# Patient Record
Sex: Female | Born: 1980 | Race: Black or African American | Hispanic: No | Smoking: Never smoker
Health system: Southern US, Community
[De-identification: ages and names within clinical notes are randomized; demographics above are authoritative.]

## PROBLEM LIST (undated history)

## (undated) ENCOUNTER — Inpatient Hospital Stay (HOSPITAL_COMMUNITY): Payer: Self-pay

## (undated) DIAGNOSIS — E669 Obesity, unspecified: Secondary | ICD-10-CM

## (undated) HISTORY — PX: DILATION AND CURETTAGE OF UTERUS: SHX78

---

## 2003-12-28 ENCOUNTER — Inpatient Hospital Stay: Payer: Self-pay

## 2004-01-03 ENCOUNTER — Inpatient Hospital Stay: Payer: Self-pay | Admitting: Unknown Physician Specialty

## 2004-01-03 ENCOUNTER — Other Ambulatory Visit: Payer: Self-pay

## 2004-07-20 ENCOUNTER — Emergency Department: Payer: Self-pay | Admitting: Emergency Medicine

## 2004-09-01 ENCOUNTER — Emergency Department: Payer: Self-pay | Admitting: Emergency Medicine

## 2004-09-25 ENCOUNTER — Emergency Department: Payer: Self-pay | Admitting: Unknown Physician Specialty

## 2004-10-11 ENCOUNTER — Ambulatory Visit: Payer: Self-pay | Admitting: Family Medicine

## 2004-11-12 ENCOUNTER — Ambulatory Visit: Payer: Self-pay | Admitting: Family Medicine

## 2005-01-22 ENCOUNTER — Emergency Department: Payer: Self-pay | Admitting: Emergency Medicine

## 2005-06-01 ENCOUNTER — Ambulatory Visit: Payer: Self-pay | Admitting: Family Medicine

## 2005-09-08 ENCOUNTER — Emergency Department: Payer: Self-pay | Admitting: Unknown Physician Specialty

## 2005-09-08 ENCOUNTER — Other Ambulatory Visit: Payer: Self-pay

## 2005-12-21 ENCOUNTER — Emergency Department: Payer: Self-pay | Admitting: Unknown Physician Specialty

## 2006-01-08 ENCOUNTER — Other Ambulatory Visit: Payer: Self-pay

## 2006-01-08 ENCOUNTER — Emergency Department: Payer: Self-pay | Admitting: Emergency Medicine

## 2006-05-08 ENCOUNTER — Emergency Department: Payer: Self-pay | Admitting: Emergency Medicine

## 2006-05-31 ENCOUNTER — Emergency Department: Payer: Self-pay | Admitting: Emergency Medicine

## 2006-10-18 ENCOUNTER — Emergency Department: Payer: Self-pay | Admitting: Emergency Medicine

## 2006-11-04 ENCOUNTER — Emergency Department: Payer: Self-pay | Admitting: Emergency Medicine

## 2006-11-05 ENCOUNTER — Ambulatory Visit: Payer: Self-pay | Admitting: Emergency Medicine

## 2006-11-26 ENCOUNTER — Ambulatory Visit: Payer: Self-pay | Admitting: Unknown Physician Specialty

## 2006-11-28 ENCOUNTER — Ambulatory Visit: Payer: Self-pay | Admitting: Unknown Physician Specialty

## 2006-12-07 ENCOUNTER — Emergency Department: Payer: Self-pay | Admitting: Emergency Medicine

## 2007-02-12 ENCOUNTER — Emergency Department: Payer: Self-pay | Admitting: Emergency Medicine

## 2007-07-19 IMAGING — CR DG CHEST 2V
1 series · 2 of 2 positions shown · non-contrast
Comparison: none

REASON FOR EXAM: shortness of breath
COMMENTS:

[Series 1: view not recorded · 0.17mm/px · 2 of 2 slices shown]
[im 1/2]
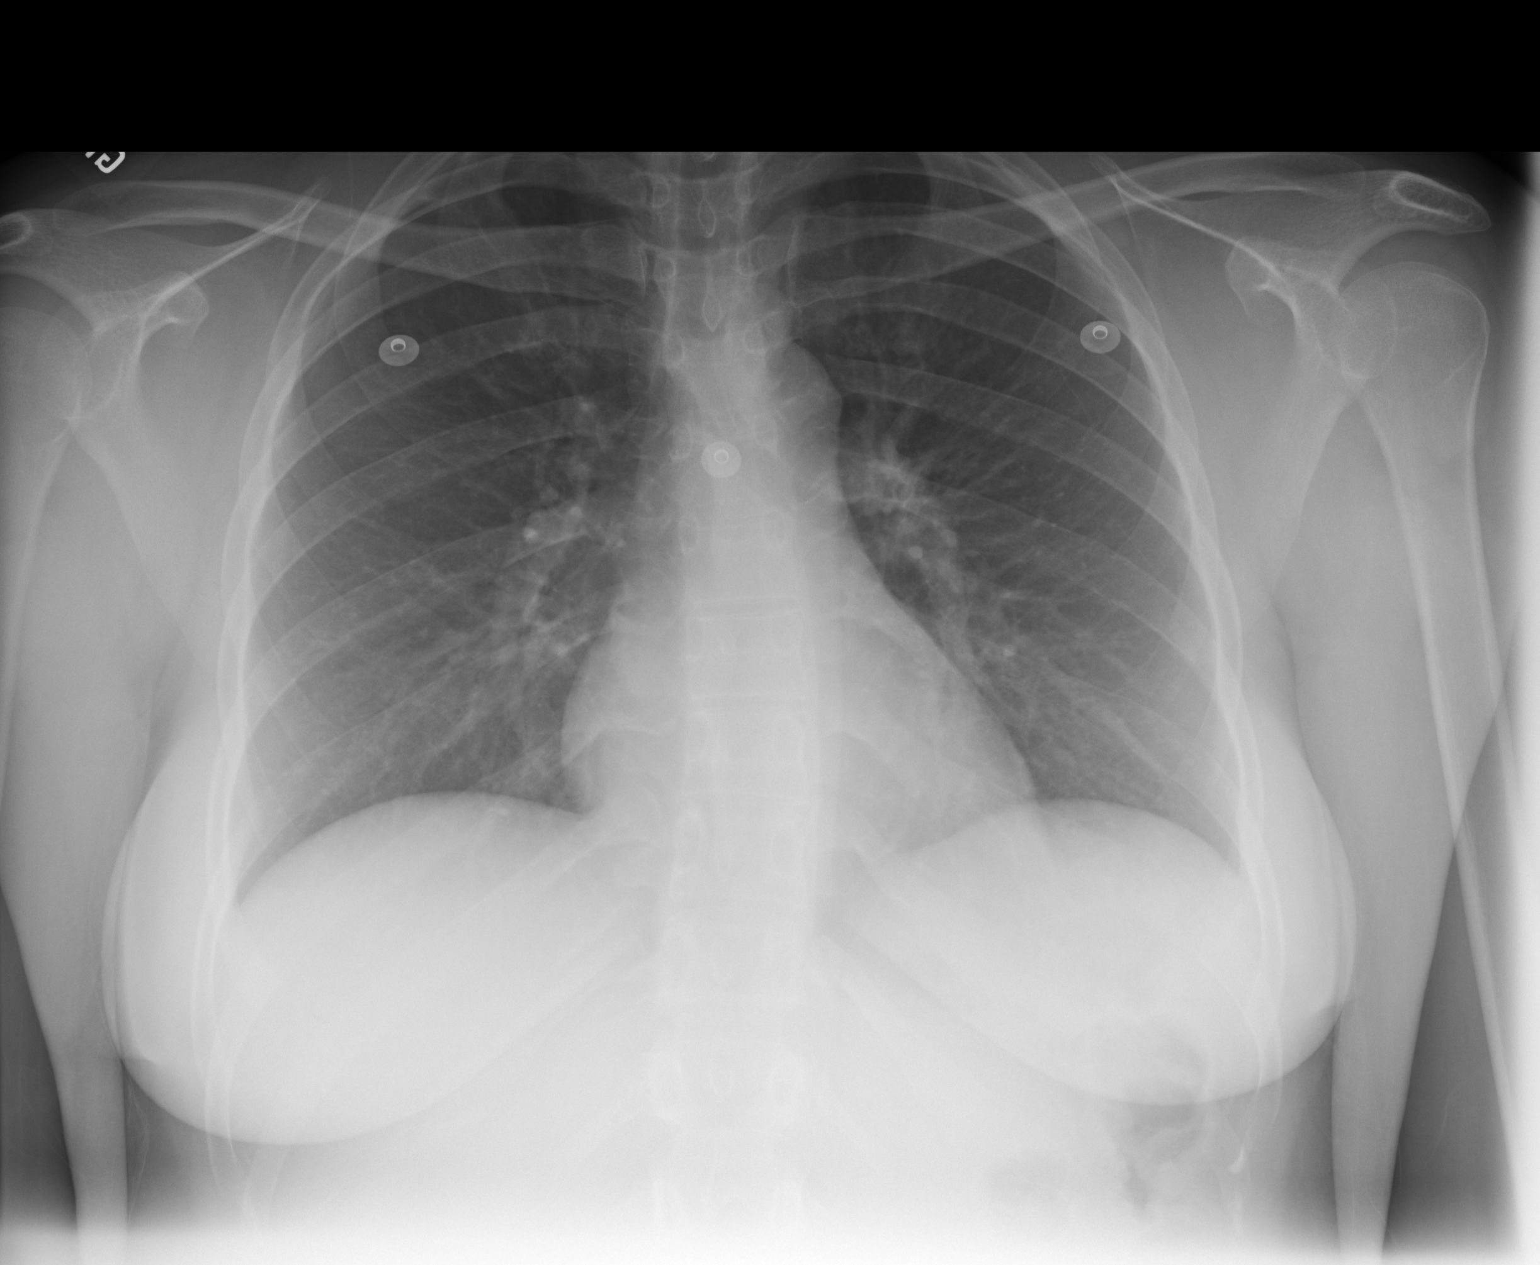
[im 2/2]
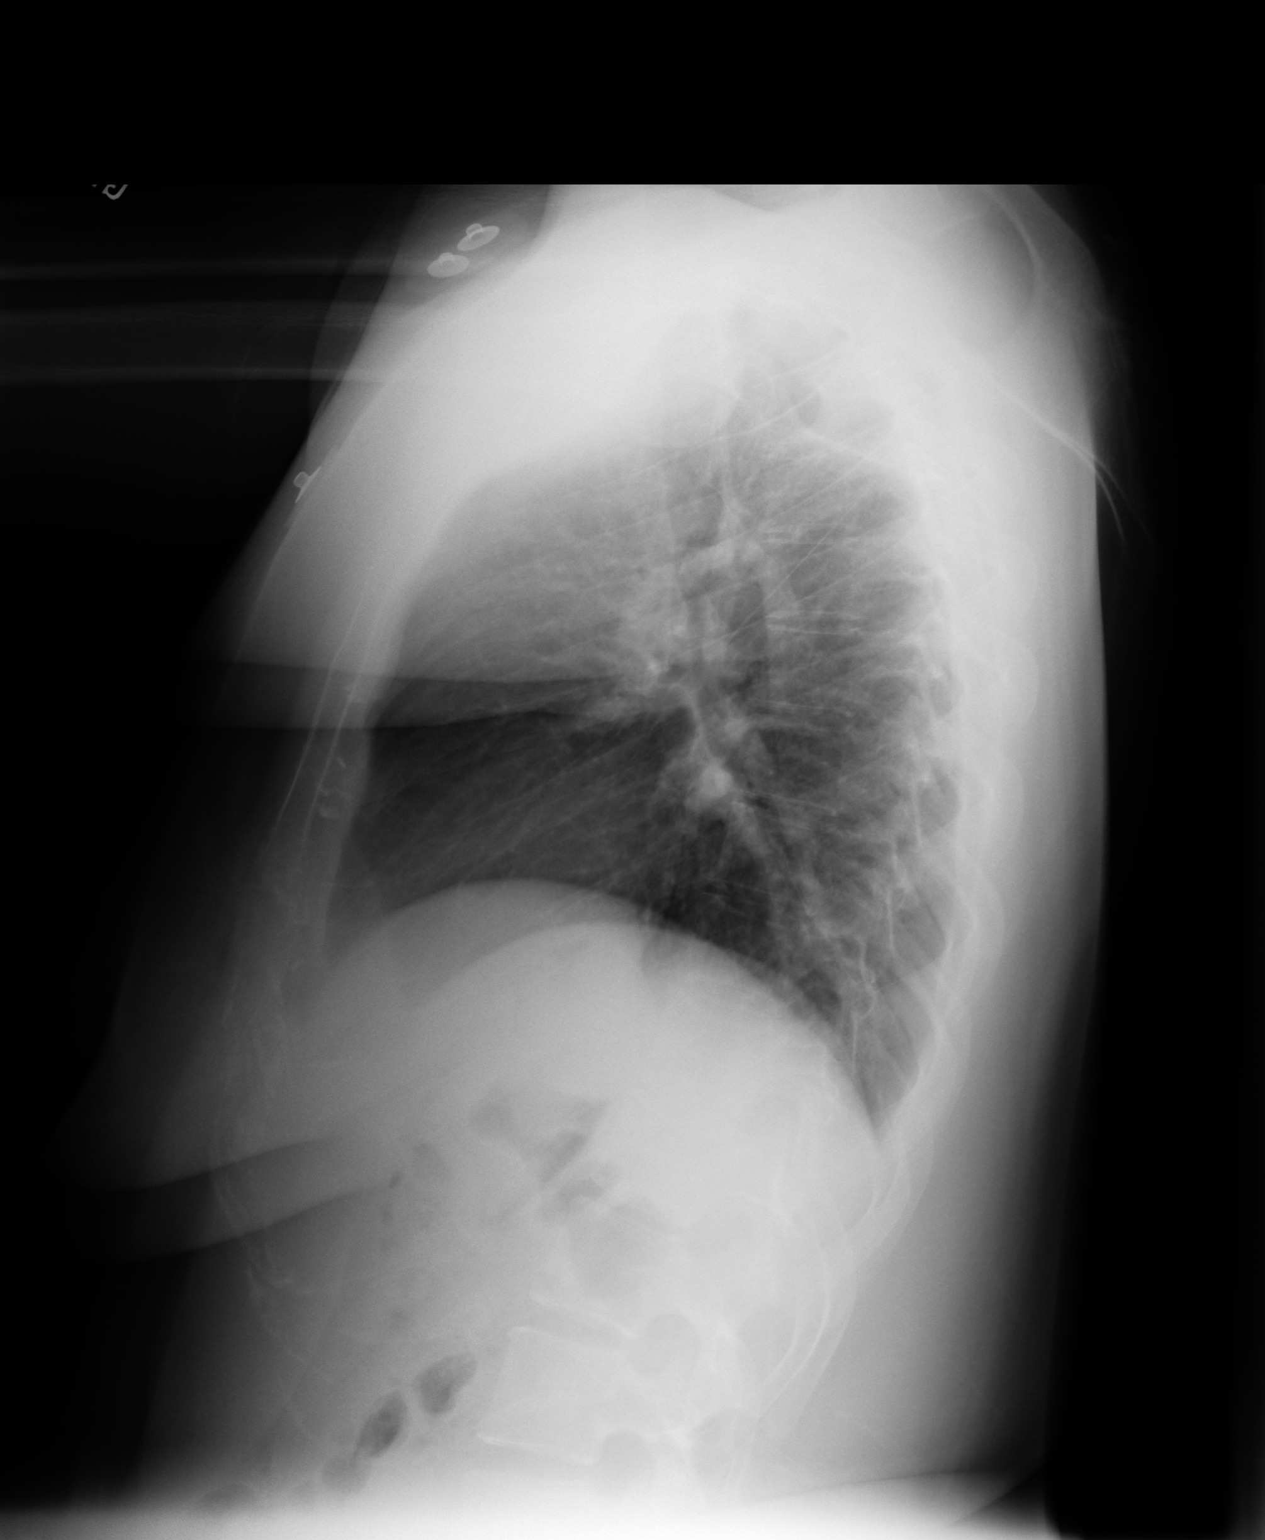

[2 of 2 positions shown; findings below may reference images not displayed]

PROCEDURE:     DXR - DXR CHEST PA (OR AP) AND LATERAL  - September 08, 2005 [DATE]

RESULT:          PA and lateral views were obtained and compared to the
prior exam of 01/23/2005.

The cardiomediastinal structures appear within normal limits.  The lung
fields are clear.  The vascularity is within normal limits with no effusions
identified.
IMPRESSION: The lung fields are clear.  The chest is basically
unchanged from the prior study.

## 2007-11-15 ENCOUNTER — Emergency Department: Payer: Self-pay | Admitting: Emergency Medicine

## 2007-11-18 IMAGING — CR DG CHEST 1V PORT
1 series · 1 of 1 positions shown · non-contrast
Comparison: none

REASON FOR EXAM: Chest pain
COMMENTS:

[view not recorded]
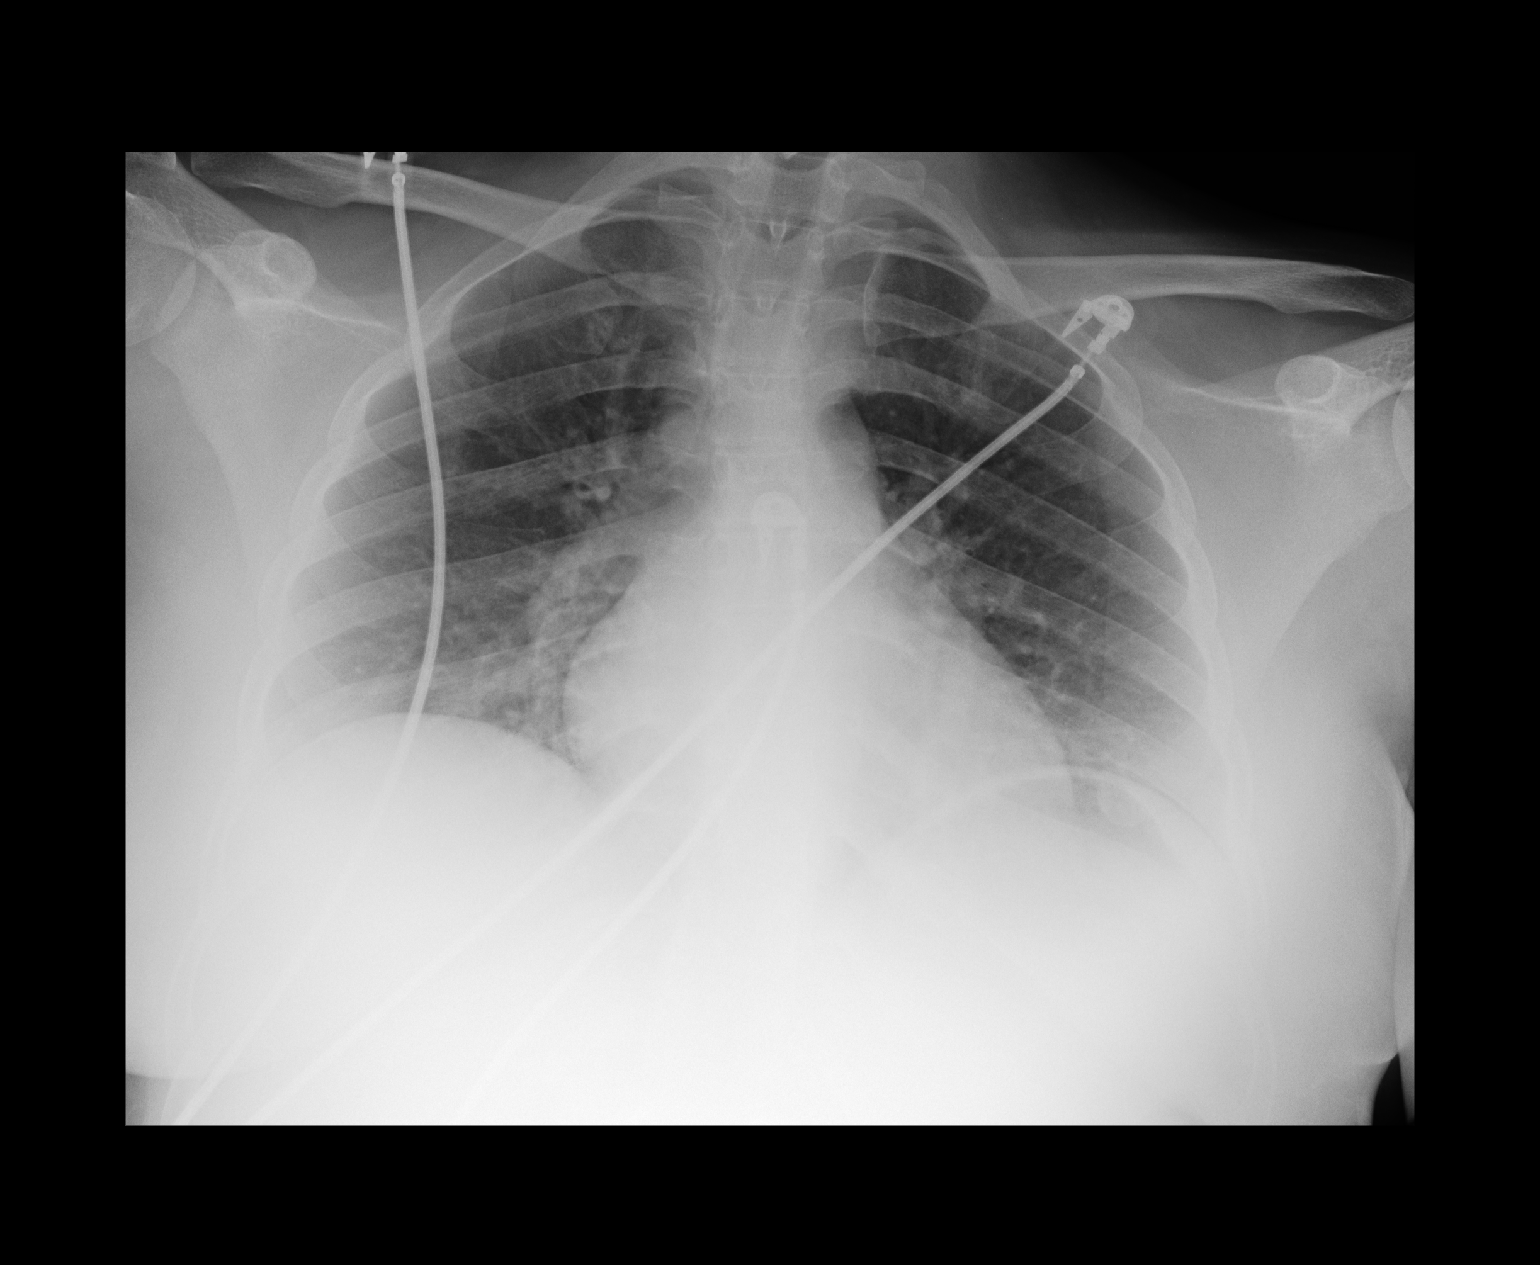

[1 of 1 positions shown; findings below may reference images not displayed]

PROCEDURE:     DXR - DXR PORTABLE CHEST SINGLE VIEW  - January 08, 2006  [DATE]

RESULT:     AP view of the chest shows the lung fields to be clear. No
pneumonia, pneumothorax or pleural effusion is seen. The heart size is
normal. Compared to a prior exam of 09/08/2005, no significant interval
changes are seen.
IMPRESSION: No acute changes are identified.

## 2009-01-07 ENCOUNTER — Emergency Department: Payer: Self-pay | Admitting: Emergency Medicine

## 2009-03-11 ENCOUNTER — Emergency Department: Payer: Self-pay | Admitting: Emergency Medicine

## 2011-09-07 ENCOUNTER — Encounter (HOSPITAL_COMMUNITY): Payer: Self-pay

## 2011-09-07 ENCOUNTER — Inpatient Hospital Stay (HOSPITAL_COMMUNITY)
Admission: AD | Admit: 2011-09-07 | Discharge: 2011-09-07 | Disposition: A | Discharge status: Home / Self Care | Payer: Medicaid Other | Source: Ambulatory Visit | Attending: Obstetrics & Gynecology | Admitting: Obstetrics & Gynecology

## 2011-09-07 ENCOUNTER — Inpatient Hospital Stay (HOSPITAL_COMMUNITY): Payer: Medicaid Other

## 2011-09-07 DIAGNOSIS — IMO0002 Reserved for concepts with insufficient information to code with codable children: Secondary | ICD-10-CM

## 2011-09-07 DIAGNOSIS — O364XX0 Maternal care for intrauterine death, not applicable or unspecified: Secondary | ICD-10-CM | POA: Insufficient documentation

## 2011-09-07 LAB — DIC (DISSEMINATED INTRAVASCULAR COAGULATION)PANEL
Prothrombin Time: 13.9 seconds (ref 11.6–15.2)
Smear Review: NONE SEEN

## 2011-09-07 LAB — KLEIHAUER-BETKE STAIN
Fetal Cells %: 0 %
Quantitation Fetal Hemoglobin: 0 mL

## 2011-09-07 NOTE — MAU Provider Note (Signed)
History     CSN: [id number]  Arrival date and time: 09/07/11 1900   First Provider Initiated Contact with Patient 09/07/11 1929      Chief Complaint  Patient presents with  . Decreased Fetal Movement   HPI This is a 31 year old G3P1010 with an EGA of 27.5 weeks with a history of a term IUFD and a spontaneous abortion around 18 weeks who presents with no fetal activity.  She is not able to state the last time she felt the baby move.  She denies vaginal bleeding, leaking fluid, vaginal discharge, fever, chills.  Her pregnancy is complicated by insulin controlled diabetes - reported under good control.  She received her prenatal care at [facility] in [place] starting around 6 weeks.  OB History    Grav Para Term Preterm Abortions TAB SAB Ect Mult Living   3 1 1  0 1 0 1 0 0 0      Past Medical History  Diagnosis Date  . Diabetes mellitus 2005    type 2    Past Surgical History  Procedure Date  . Dilation and curettage of uterus     No family history on file.  History  Substance Use Topics  . Smoking status: Never Smoker   . Smokeless tobacco: Not on file  . Alcohol Use: No    Allergies:  Allergies  Allergen Reactions  . Penicillins     Doesn't know reaction    No prescriptions prior to admission    Review of Systems  Constitutional: Negative for fever, chills and weight loss.  Eyes: Negative for blurred vision and photophobia.  Cardiovascular: Negative for chest pain and palpitations.  Gastrointestinal: Negative for heartburn, nausea, vomiting, abdominal pain, diarrhea and constipation.  Genitourinary: Negative for urgency and frequency.  Neurological: Negative for dizziness, weakness and headaches.   Physical Exam   Blood pressure 122/73, pulse 79, temperature 99.1 F (37.3 C), temperature source Oral, resp. rate 20, height 5\' 9"  (1.753 m), weight 121.383 kg (267 lb 9.6 oz), SpO2 100.00%.  Physical Exam  Constitutional: She is oriented to  person, place, and time. She appears well-developed and well-nourished.  HENT:  Head: Normocephalic and atraumatic.  GI: Soft. She exhibits no distension and no mass. There is no tenderness. There is no rebound and no guarding.  Neurological: She is alert and oriented to person, place, and time.  Skin: Skin is warm and dry.  Psychiatric: She has a normal mood and affect. Her behavior is normal. Judgment and thought content normal.   Results for orders placed during the hospital encounter of 09/07/11 (from the past 24 hour(s))  DIC (DISSEMINATED INTRAVASCULAR COAGULATION) PANEL     Status: Abnormal   Collection Time   09/07/11  7:57 PM      Component Value Range   Prothrombin Time 13.9  11.6 - 15.2 seconds   INR 1.05  0.00 - 1.49   aPTT 28  24 - 37 seconds   Fibrinogen 428  204 - 475 mg/dL   D-Dimer, Quant 1.30 (*) 0.00 - 0.48 ug/mL-FEU   Platelets 203  150 - 400 K/uL   Smear Review NO SCHISTOCYTES SEEN       *RADIOLOGY REPORT*  Clinical Data: 31 year old pregnant female with estimated gestation  of 27 weeks 4 days. No fetal movement or heartbeats on exam.  LIMITED OBSTETRIC ULTRASOUND  Number of Fetuses: 1  Heart Rate: Not identified  Movement: Absent  Presentation: Cephalic  Placental Location: Anterior  Previa: None  Amniotic Fluid (Subjective): Decreased  Vertical pocket: 2.9 cm  FL: 3.8 cm 22w 3d  MATERNAL FINDINGS:  Uterus/Adnexae: Unremarkable.  IMPRESSION:  Fetal demise.  Original Report Authenticated By: Rosendo Gros, M.D.   MAU Course  Procedures  Assessment and Plan  1.  27.5 week fetal demise  Discussed options with patient - staying and inducing labor tonight or go home and following up with her physicians at [facility].  I did call and spoke with the physician on call - Dr Allena Katz.  She was notified of the fetal demise and she will arrange for the patient to be called by one of the perinatal nurses from the clinic on Monday.    STINSON, JACOB JEHIEL 09/07/2011,  8:06 PM

## 2011-09-07 NOTE — MAU Note (Signed)
Pt brought to MAU 6 for no fetal movement. Attempted to place patient on fetal monitor at 60. Sharen Counter CNM at bedside at [facility] with bedside ultrasound to verify heart rate. Dr. Adrian Blackwater & ultrasound tech at bedside at 1929 for official bedside ultrasound. Fetal demise per official ultrasound & Dr. Adrian Blackwater.  Pt doesn't know last time she felt baby move. Gets prenatal care at [facility] in [place]. Denies contractions, vaginal bleeding or leaking of fluid.

## 2011-09-08 DIAGNOSIS — E669 Obesity, unspecified: Secondary | ICD-10-CM | POA: Insufficient documentation

## 2012-02-24 ENCOUNTER — Emergency Department: Payer: Self-pay | Admitting: Emergency Medicine

## 2012-03-10 ENCOUNTER — Emergency Department: Payer: Self-pay | Admitting: Emergency Medicine

## 2012-03-10 LAB — URINALYSIS, COMPLETE
Bilirubin,UR: NEGATIVE
Nitrite: NEGATIVE
Protein: NEGATIVE
RBC,UR: 1 /HPF (ref 0–5)
Squamous Epithelial: 2

## 2012-03-11 ENCOUNTER — Emergency Department: Payer: Self-pay | Admitting: Emergency Medicine

## 2012-03-11 LAB — CBC
HGB: 13.9 g/dL (ref 12.0–16.0)
MCHC: 32.6 g/dL (ref 32.0–36.0)
MCV: 88 fL (ref 80–100)
Platelet: 190 10*3/uL (ref 150–440)
RBC: 4.87 10*6/uL (ref 3.80–5.20)
RDW: 12.9 % (ref 11.5–14.5)

## 2012-03-11 LAB — URINALYSIS, COMPLETE
Bilirubin,UR: NEGATIVE
Glucose,UR: >=500 mg/dL (ref 0–75)
Leukocyte Esterase: NEGATIVE
Nitrite: NEGATIVE
Ph: 9 (ref 4.5–8.0)
Protein: 100
RBC,UR: 7624 /HPF (ref 0–5)
Specific Gravity: 1.03 (ref 1.003–1.030)

## 2012-04-10 ENCOUNTER — Emergency Department: Payer: Self-pay | Admitting: Internal Medicine

## 2012-04-10 LAB — URINALYSIS, COMPLETE
Bacteria: NONE SEEN
Bilirubin,UR: NEGATIVE
Glucose,UR: 150 mg/dL (ref 0–75)
RBC,UR: 4 /HPF (ref 0–5)
Specific Gravity: 1.025 (ref 1.003–1.030)
Squamous Epithelial: 10
WBC UR: 16 /HPF (ref 0–5)

## 2012-04-10 LAB — COMPREHENSIVE METABOLIC PANEL
Albumin: 3.8 g/dL (ref 3.4–5.0)
Alkaline Phosphatase: 78 U/L (ref 50–136)
BUN: 11 mg/dL (ref 7–18)
Bilirubin,Total: 0.6 mg/dL (ref 0.2–1.0)
Calcium, Total: 8.7 mg/dL (ref 8.5–10.1)
Chloride: 103 mmol/L (ref 98–107)
Creatinine: 0.83 mg/dL (ref 0.60–1.30)
EGFR (African American): >60
EGFR (Non-African Amer.): >60
SGOT(AST): 15 U/L (ref 15–37)
SGPT (ALT): 19 U/L (ref 12–78)
Total Protein: 7.8 g/dL (ref 6.4–8.2)

## 2012-04-10 LAB — CBC
HCT: 42.1 % (ref 35.0–47.0)
MCHC: 32.4 g/dL (ref 32.0–36.0)
RBC: 4.86 10*6/uL (ref 3.80–5.20)
WBC: 6.5 10*3/uL (ref 3.6–11.0)

## 2012-04-28 ENCOUNTER — Emergency Department: Payer: Self-pay | Admitting: Emergency Medicine

## 2012-05-11 DIAGNOSIS — E119 Type 2 diabetes mellitus without complications: Secondary | ICD-10-CM | POA: Insufficient documentation

## 2013-07-17 IMAGING — US US OB LIMITED
1 series · 14 of 18 positions shown · non-contrast
Comparison: none

CLINICAL DATA: 30-year-old pregnant female with estimated gestation
of 27 weeks 4 days.  No fetal movement or heartbeats on exam.

LIMITED OBSTETRIC ULTRASOUND
Number of Fetuses: 1
Heart Rate: Not identified
Movement: Absent
Presentation: Cephalic
Placental Location: Anterior
Previa: None
Amniotic Fluid (Subjective): Decreased
Vertical pocket:  2.9 cm
FL: 3.8 cm    22w   3d
MATERNAL FINDINGS:
Uterus/Adnexae: Unremarkable.

[Series 1: us ob limited · 18 acquisitions, 14 frames shown]
[im 1/18]
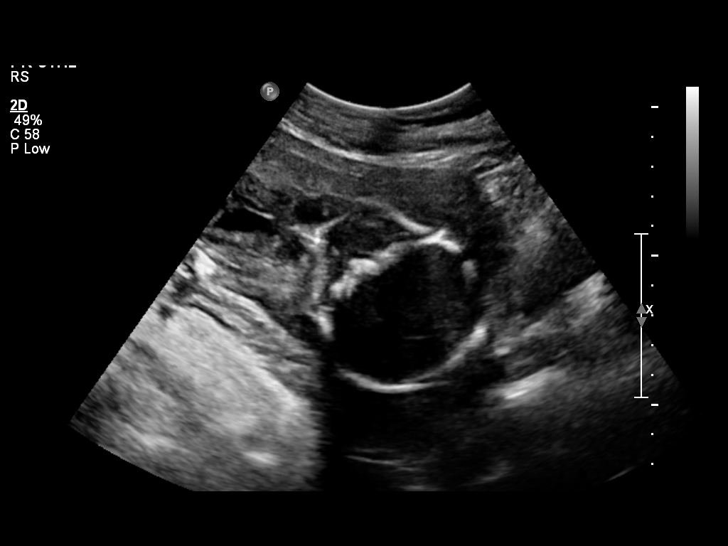
[im 2/18]
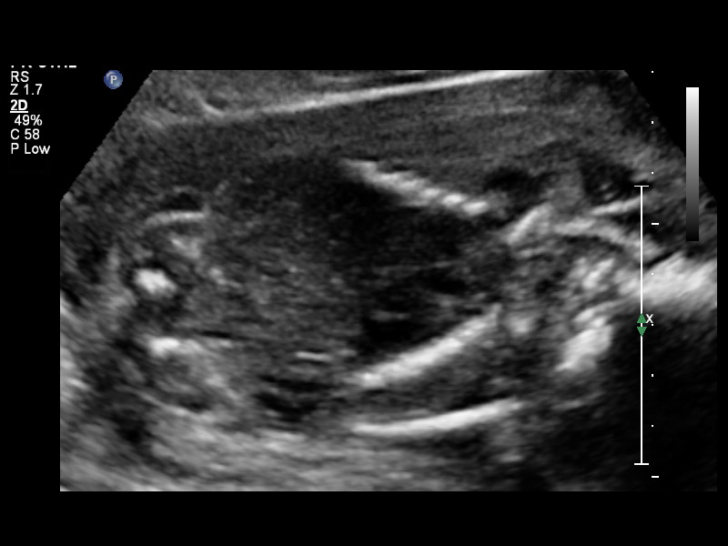
[im 4/18]
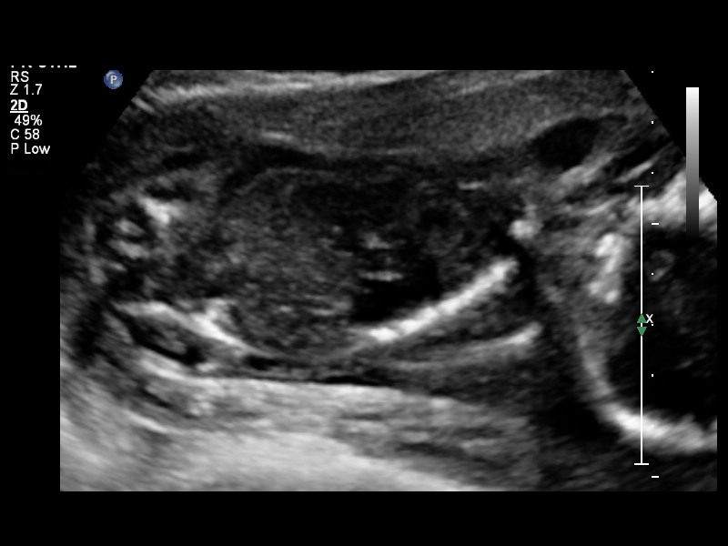
[im 5/18]
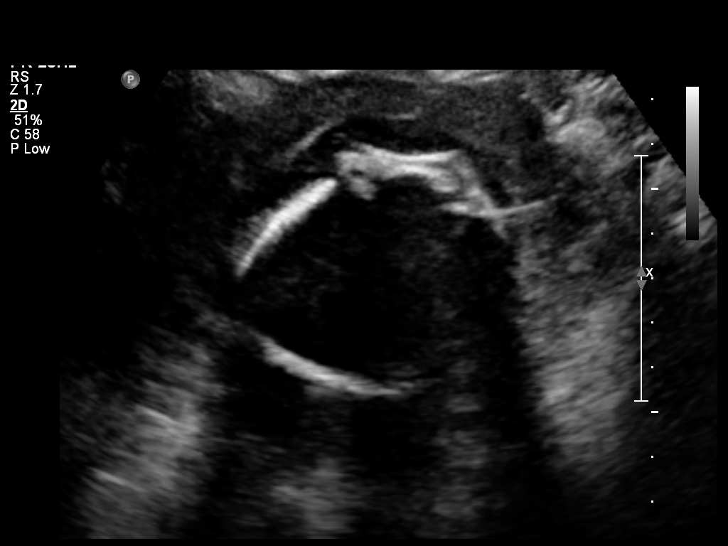
[im 6/18]
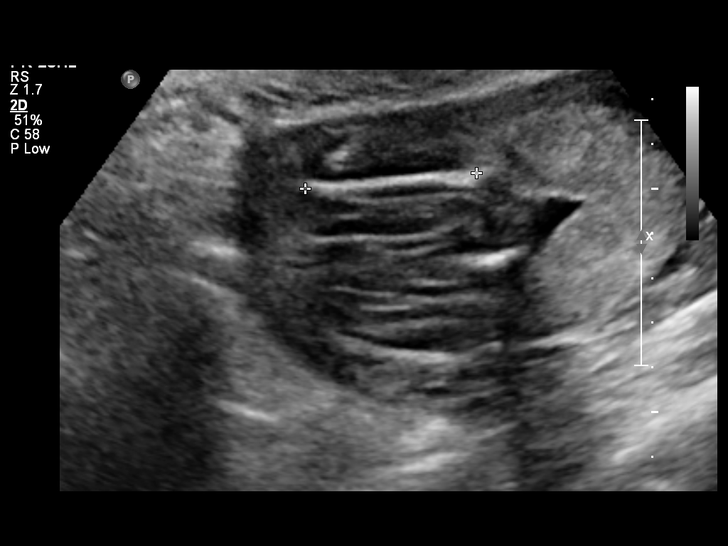
[im 8/18]
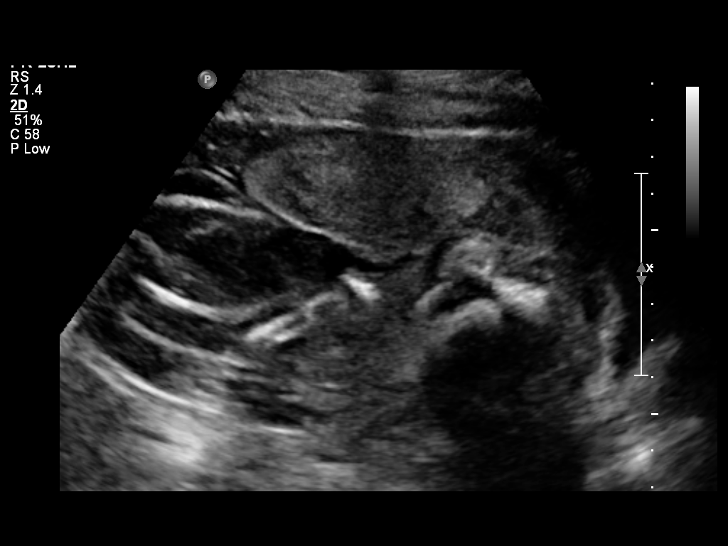
[im 9/18]
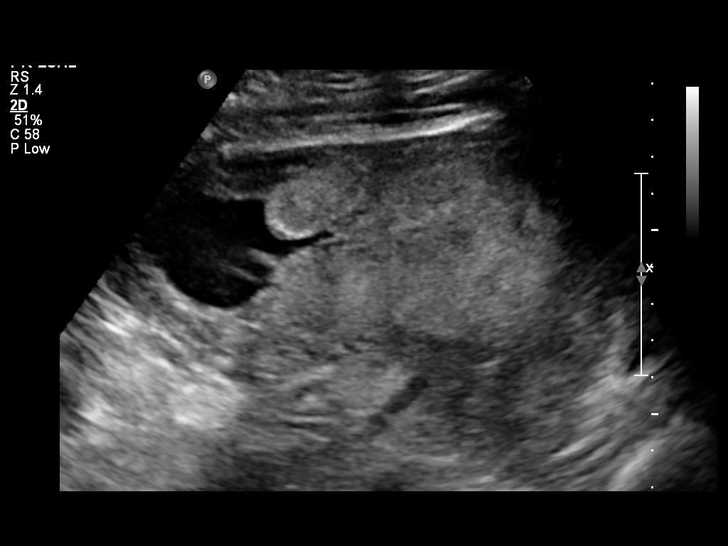
[im 10/18]
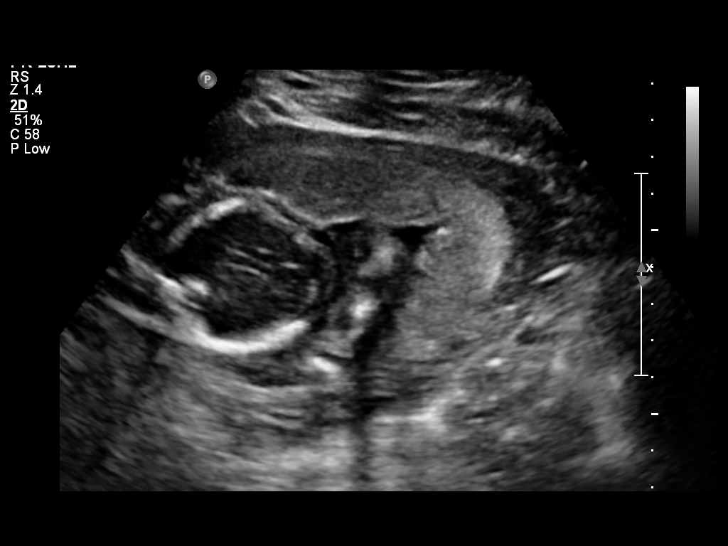
[im 11/18]
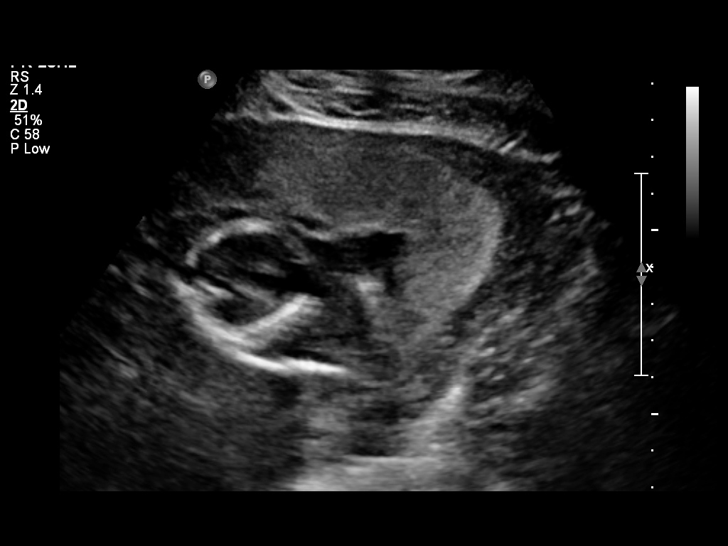
[im 13/18]
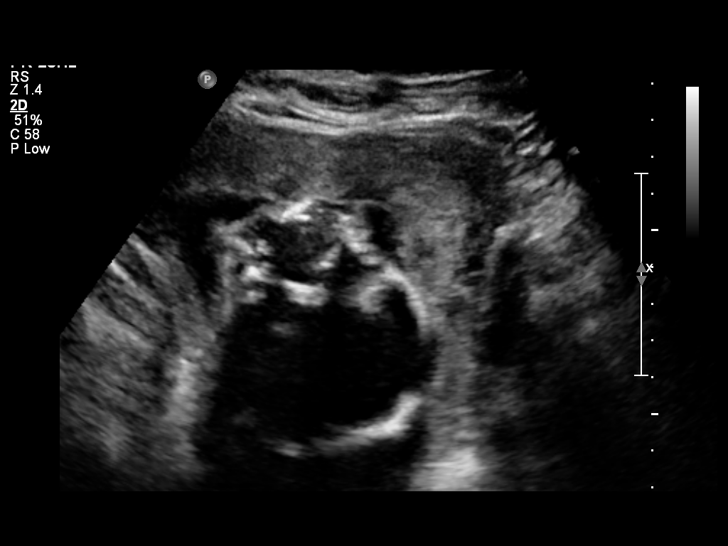
[im 14/18]
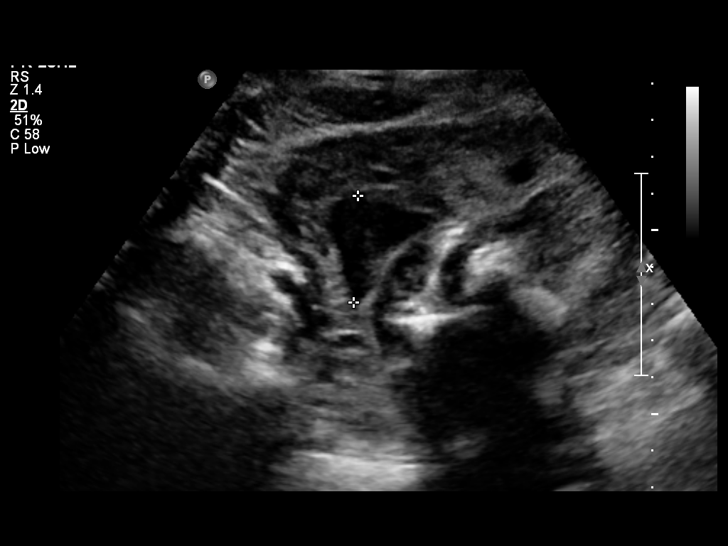
[im 15/18]
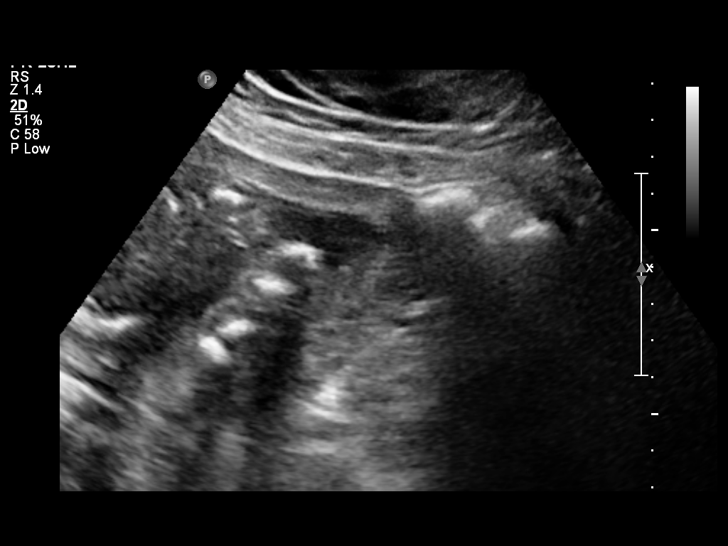
[im 17/18]
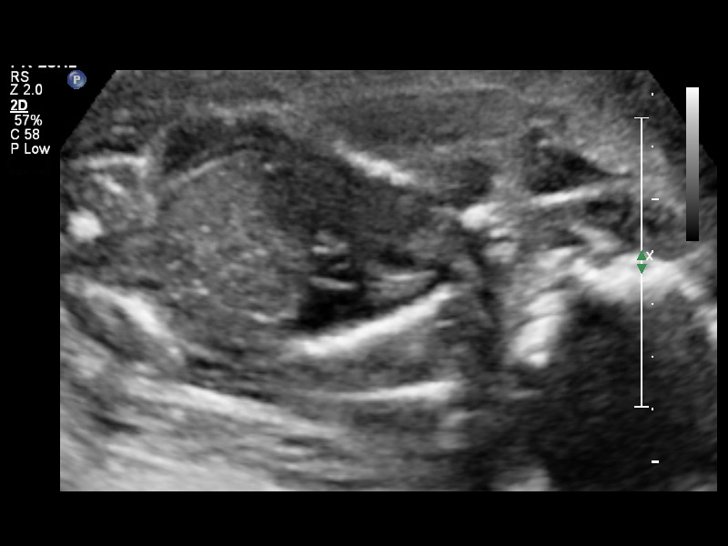
[im 18/18]
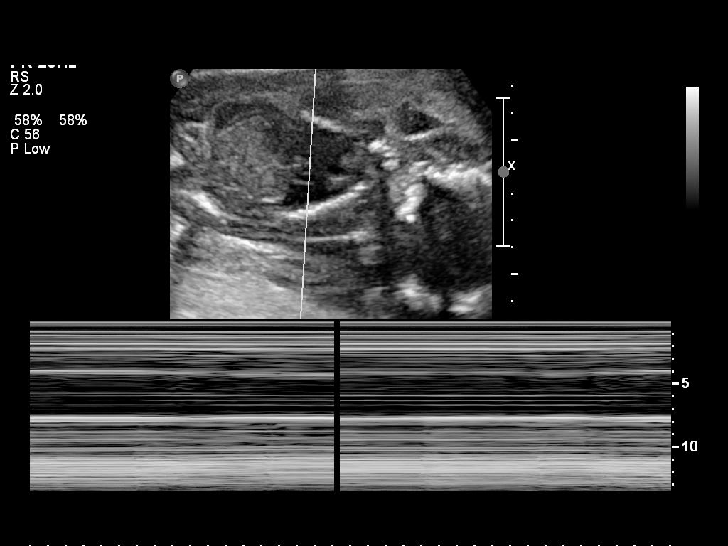

[14 of 18 positions shown; findings below may reference images not displayed]

IMPRESSION: Fetal demise.

## 2013-08-27 ENCOUNTER — Ambulatory Visit: Payer: Self-pay | Admitting: Family Medicine

## 2013-09-12 ENCOUNTER — Ambulatory Visit: Payer: Self-pay | Admitting: Family Medicine

## 2013-09-22 ENCOUNTER — Emergency Department: Payer: Self-pay | Admitting: Internal Medicine

## 2013-10-13 ENCOUNTER — Ambulatory Visit: Payer: Self-pay | Admitting: Family Medicine

## 2013-10-29 ENCOUNTER — Encounter: Payer: Self-pay | Admitting: Maternal & Fetal Medicine

## 2013-11-12 ENCOUNTER — Ambulatory Visit: Payer: Self-pay | Admitting: Family Medicine

## 2013-12-14 ENCOUNTER — Encounter (HOSPITAL_COMMUNITY): Payer: Self-pay

## 2013-12-21 ENCOUNTER — Emergency Department: Payer: Self-pay | Admitting: Emergency Medicine

## 2014-01-15 ENCOUNTER — Emergency Department: Payer: Self-pay | Admitting: Emergency Medicine

## 2014-05-06 ENCOUNTER — Ambulatory Visit
Admit: 2014-05-06 | Disposition: A | Discharge status: Home / Self Care | Payer: Self-pay | Attending: Internal Medicine | Admitting: Internal Medicine

## 2014-06-05 NOTE — Consult Note (Signed)
Referral Information:  Reason for Referral 34 yo [phone] here for preconception counseling due to Type 2 DM and history of IUFD at 65 weeks ('cord accident'), 18 week pregnancy loss(delivered in toilet--reports no contractions or bleeding), 28 week IUFD (macerated fetus, infarct noted on placenta and fetal karyotype normal. She has had a preconception consultation with [facility] in 2013 and with a Education officer, environmental.  She declined maternal/paternal karyotype at the time of that visit.  A partial thrombophila panel (lab reported no specimen for additional tests) demonstrated normal Factor V and Prothrombin Gene testing.  Her mother had a lower extrem DVT at age 71.   Referring Physician [facility]   Past Obstetrical Hx 2005, [facility], IUFD--reports double nuchal cord 2007--18 week delivery--reports going to bathroom and noticing fetus in toilet 2013--28 week IUFD, [facility], macreated fetus, Benyamin Jeff infarct noted on placental path, karyotype negative, IgG CMV positive, IGM negative, autopsy negative. She took low dose aspirin during that pregnancy previous pregnancies were with prior husband; she is now with a different partner   Home Medications: Medication Instructions Status  NovoLOG Mix 70/30 30 units-70 units/mL subcutaneous suspension 45 unit(s) subcutaneous once a day before breakfast and 35 units before supper Active   Allergies:   PCN: Rash  Bactrim: N/V/Diarrhea  Vital Signs/Notes:  Nursing Vital Signs: **Vital Signs.:   17-Sep-15 09:15  Vital Signs Type Routine  Temperature Temperature (F) 98.5  Celsius 36.9  Temperature Source oral  Pulse Pulse 84  Respirations Respirations 18  Systolic BP Systolic BP 136  Diastolic BP (mmHg) Diastolic BP (mmHg) 64  Mean BP 88  Pulse Ox % Pulse Ox % 98  Oxygen Delivery Room Air/ 21 %   Perinatal Consult:  PGyn Hx 2013 pap, aSCUS positive HPV   Past Medical History cont'd T2 DM dignosed incidentally in 2005--she saw a woman in the [employer]  with DM and decided to check her blood glucose--she then went to ED for evaluation and treatment of DM she reports recent HgbA1c 8% FASTING blood glucose 209 mg/dl today she did not bring log book she reports normal opthalmologic exam within the last year and no history of end organ disease/proteinuria   PSurg Hx dilation and curretage x 2   FHx thromboses, metabolic disorders either side, mother DVT, HTN, DM   Occupation Mother works in group home   Occupation Father also works in [employer] group home--new partner   Impression/Recommendations:  Impression 34 yo [id number] here for preconception consultation due to T2 DM IUFD x 2 and 18 week pregnancy loss.  I agree with prior recommendations to complete a thrombophilia evaluation--she still will need antiphospholipid antibody screening, protein C and s, antithrombin 3 testing.  We addressed improved glycemic control prior to pregnancy, although I agree with previous consultation that this is not likely the cause of her IUFDs.  Karyotype was offered to her in the past, however not obtained.  SIS was also recommended to evalaute her uterine cavity.  We addressed diabetes management during pregnancy and the importance of preconception glycemic control to reduce risk for birth defects.  She is currently taking a multivitamin containing folic acid.  I recommended she delay pregnancy for 2 months in order to optimize her glycemic control and overall health.   2. LSIL pap with incomplete colposcopy in 2014--we did not address her follow up.  She can be referred back to the cervix clinic at [facility] for reevaluation if she has not had follow up cervical screening since that 2014  cervix clinic visit.   Recommendations 1. Recommended completion of thrombophilia panel. She currently is uninsured and did not wish to pay for those tests. Recommended she have them performed as soon as pregnancy confirmed.   She will need: antiphospholipid antibody screening  (aplas, anti B2 glycoprotein) and protein C and S  She will be on low dose aspirin for preeclampsia risk reduction (due to levated bmi and T2 DM) but may need lovenox if condition such as APLAS diagnosed 2. Recommend TSH, if not performed recently, continue prenatal vitamins 3. I recommended improved glycemic control prior to pregnancy--she did not bring log book today--I stressed the need for her  to check blood glucose regularly and follow diabetic diet. I also recommended she begin an exercise program (walking) and try to decrease weight prior to conception 4. Recommend baseline cervical length assessment at time of midtrimester anatomic survey and q 2-4 weeks (can perform with growth scans) until 28 weeks.  5. I would recommend she have prenatal care in a high risk ob practice   Plan:  Prenatal Diagnosis Options Level II US   Ultrasound at what gestational ages Monthly >24 weeks   Antepartum Testing Weekly, starting at 28 weeks, due to IUFD, twice weekly at ~34 weeks due to DM   Additional Testing HbA1C, Folate/prenatal vitamins   Delivery at what gestational age < 39 weeks    Total Time Spent with Patient 45 minutes   >50% of visit spent in couseling/coordination of care yes   Office Use Only [id number]  Level 3 (40min) NEW office consult detailed   Coding Description: MATERNAL CONDITIONS/HISTORY INDICATION(S).   Diabetes - DM.  Electronic Signatures: Consuelo PandySmall, Shabreka Coulon (MD)  (Signed 17-Sep-15 15:39)  Authored: Referral, Home Medications, Allergies, Vital Signs/Notes, Consult, Impression, Plan, Billing, Coding Description   Last Updated: 17-Sep-15 15:39 by Consuelo PandySmall, Antonetta Clanton (MD)

## 2014-07-13 ENCOUNTER — Encounter (INDEPENDENT_AMBULATORY_CARE_PROVIDER_SITE_OTHER): Payer: Self-pay

## 2014-07-13 ENCOUNTER — Ambulatory Visit: Payer: Medicaid Other

## 2014-08-05 ENCOUNTER — Ambulatory Visit: Payer: Self-pay | Admitting: Ophthalmology

## 2014-09-09 ENCOUNTER — Ambulatory Visit: Payer: Self-pay

## 2014-09-21 ENCOUNTER — Ambulatory Visit: Payer: Self-pay

## 2014-10-07 ENCOUNTER — Ambulatory Visit: Payer: Self-pay

## 2014-10-17 ENCOUNTER — Emergency Department
Admission: EM | Admit: 2014-10-17 | Discharge: 2014-10-17 | Disposition: A | Discharge status: Home / Self Care | Payer: Medicaid Other | Attending: Emergency Medicine | Admitting: Emergency Medicine

## 2014-10-17 ENCOUNTER — Encounter: Payer: Self-pay | Admitting: Emergency Medicine

## 2014-10-17 DIAGNOSIS — Z3202 Encounter for pregnancy test, result negative: Secondary | ICD-10-CM | POA: Insufficient documentation

## 2014-10-17 DIAGNOSIS — H9202 Otalgia, left ear: Secondary | ICD-10-CM | POA: Insufficient documentation

## 2014-10-17 DIAGNOSIS — Z880 Allergy status to penicillin: Secondary | ICD-10-CM | POA: Insufficient documentation

## 2014-10-17 DIAGNOSIS — K0889 Other specified disorders of teeth and supporting structures: Secondary | ICD-10-CM

## 2014-10-17 DIAGNOSIS — E119 Type 2 diabetes mellitus without complications: Secondary | ICD-10-CM | POA: Insufficient documentation

## 2014-10-17 DIAGNOSIS — K088 Other specified disorders of teeth and supporting structures: Secondary | ICD-10-CM | POA: Insufficient documentation

## 2014-10-17 MED ORDER — CLINDAMYCIN HCL 150 MG PO CAPS: 300.0000 mg | ORAL_CAPSULE | Freq: Three times a day (TID) | ORAL | Status: DC

## 2014-10-17 MED ORDER — TRAMADOL HCL 50 MG PO TABS: 50.0000 mg | ORAL_TABLET | Freq: Four times a day (QID) | ORAL | Status: DC | PRN

## 2014-10-17 MED ORDER — IBUPROFEN 800 MG PO TABS: 800.0000 mg | ORAL_TABLET | Freq: Three times a day (TID) | ORAL | Status: DC

## 2014-10-17 NOTE — ED Notes (Signed)
Pt reports toothache x2 days, now with left ear pain.

## 2014-10-17 NOTE — ED Provider Notes (Signed)
[facility] Emergency Department Provider Note  ____________________________________________  Time seen: Approximately 7:04 PM  I have reviewed the triage vital signs and the nursing notes.   HISTORY  Chief Complaint Dental Pain and Otalgia   HPI TEKOA HAMOR is a 34 y.o. female is here with complaint of toothache 2 days. She also now has a left earache. She denies any fever or chills. She denies any URI symptoms. She has not been to a dentist and at least 3-4 years. She is not taking any over-the-counter medication for her dental pain. She states the pain in her left ear is 10 out of 10.   Past Medical History  Diagnosis Date  . Diabetes mellitus 2005    type 2    There are no active problems to display for this patient.   Past Surgical History  Procedure Laterality Date  . Dilation and curettage of uterus      Current Outpatient Rx  Name  Route  Sig  Dispense  Refill  . clindamycin (CLEOCIN) 150 MG capsule   Oral   Take 2 capsules (300 mg total) by mouth 3 (three) times daily.   60 capsule   0   . ibuprofen (ADVIL,MOTRIN) 800 MG tablet   Oral   Take 1 tablet (800 mg total) by mouth 3 (three) times daily.   30 tablet   0   . traMADol (ULTRAM) 50 MG tablet   Oral   Take 1 tablet (50 mg total) by mouth every 6 (six) hours as needed.   8 tablet   0     Allergies Penicillins  No family history on file.  Social History Social History  Substance Use Topics  . Smoking status: Never Smoker   . Smokeless tobacco: None  . Alcohol Use: No    Review of Systems Constitutional: No fever/chills Eyes: No visual changes. ENT: No sore throat.  Positive for dental pain. Positive for left ear pain. Cardiovascular: Denies chest pain. Respiratory: Denies shortness of breath. Gastrointestinal: No abdominal pain.  No nausea, no vomiting. . Genitourinary: Negative for dysuria. Musculoskeletal: Negative for back pain. Skin: Negative for  rash. Neurological: Negative for headaches, focal weakness or numbness.  10-point ROS otherwise negative.  ____________________________________________   PHYSICAL EXAM:  VITAL SIGNS: ED Triage Vitals  Enc Vitals Group     BP 10/17/14 1751 136/80 mmHg     Pulse Rate 10/17/14 1751 88     Resp 10/17/14 1751 18     Temp 10/17/14 1751 99.7 F (37.6 C)     Temp Source 10/17/14 1751 Oral     SpO2 10/17/14 1751 99 %     Weight 10/17/14 1751 240 lb (108.863 kg)     Height 10/17/14 1751  (1.753 m)     Head Cir --      Peak Flow --      Pain Score 10/17/14 1756 10     Pain Loc --      Pain Edu? --      Excl. in GC? --     Constitutional: Alert and oriented. Well appearing and in no acute distress. Eyes: Conjunctivae are normal. PERRL. EOMI. Head: Atraumatic. Nose: No congestion/rhinnorhea.   Ears: Right EAC and TM clear. Left EAC is clear left TM dull but no erythema is noted. Mouth/Throat: Mucous membranes are moist.  Oropharynx non-erythematous. Left upper molars in poor hygiene and repair. There is no obvious cavity there is some tenderness on palpation of  the gum around particular tooth in question. Neck: No stridor.  Supple Hematological/Lymphatic/Immunilogical: No cervical lymphadenopathy. Cardiovascular: Normal rate, regular rhythm. Grossly normal heart sounds.  Good peripheral circulation. Respiratory: Normal respiratory effort.  No retractions. Lungs CTAB. Gastrointestinal: Soft and nontender. No distention. No abdominal bruits. No CVA tenderness. Musculoskeletal: No lower extremity tenderness nor edema.  No joint effusions. Neurologic:  Normal speech and language. No gross focal neurologic deficits are appreciated. No gait instability. Skin:  Skin is warm, dry and intact. No rash noted. Psychiatric: Mood and affect are normal. Speech and behavior are normal.  ____________________________________________   LABS (all labs ordered are listed, but only abnormal results  are displayed)  Labs Reviewed - No data to display _ PROCEDURES  Procedure(s) performed: None  Critical Care performed: No  ____________________________________________   INITIAL IMPRESSION / ASSESSMENT AND PLAN / ED COURSE  Pertinent labs & imaging results that were available during my care of the patient were reviewed by me and considered in my medical decision making (see chart for details).  Patient was given a list of dental clinics to follow up with. She was started on iron clindamycin for infection, ibuprofen 800 mg for inflammation and pain. She is also given tramadol 8 tablets until she can contact someone on Tuesday. ____________________________________________   FINAL CLINICAL IMPRESSION(S) / ED DIAGNOSES  Final diagnoses:  Pain, dental  Acute ear pain, left      Tommi Rumps, PA-C 10/17/14 2010  Sharyn Creamer, MD 10/18/14 0001

## 2014-10-17 NOTE — Discharge Instructions (Signed)
Dental Pain °Toothache is pain in or around a tooth. It may get worse with chewing or with cold or heat.  °HOME CARE °· Your dentist may use a numbing medicine during treatment. If so, you may need to avoid eating until the medicine wears off. Ask your dentist about this. °· Only take medicine as told by your dentist or doctor. °· Avoid chewing food near the painful tooth until after all treatment is done. Ask your dentist about this. °GET HELP RIGHT AWAY IF:  °· The problem gets worse or new problems appear. °· You have a fever. °· There is redness and puffiness (swelling) of the face, jaw, or neck. °· You cannot open your mouth. °· There is pain in the jaw. °· There is very bad pain that is not helped by medicine. °MAKE SURE YOU:  °· Understand these instructions. °· Will watch your condition. °· Will get help right away if you are not doing well or get worse. °Document Released: 07/18/2007 Document Revised: 04/23/2011 Document Reviewed: 07/18/2007 °ExitCare® Patient Information ©2015 [employer]. This information is not intended to replace advice given to you by your health care provider. Make sure you discuss any questions you have with your health care provider. °OPTIONS FOR DENTAL FOLLOW UP CARE ° °[place] Department of Health and Human Services - Local Safety Net Dental Clinics °http://www.ncdhhs.gov/dph/oralhealth/services/safetynetclinics.htm °  °[facility] (336-562-3123) ° °[facility] (919-933-9087) ° °[facility] (919-663-1744 ext [phone]) ° °[facility] (336-570-6415) ° °[facility] (919-968-2025) °This clinic caters to the indigent population and is on a lottery system. °Location: °[facility], [address], [place] °Clinic Hours: °Wednesdays from 6pm - 9pm, patients seen by a lottery system. °For dates, call or go to www.med.[facility].edu/[facility]/patients/Dental-[facility] °Services: °Cleanings, fillings and simple  extractions. °Payment Options: °DENTAL WORK IS FREE OF CHARGE. Bring proof of income or support. °Best way to get seen: °Arrive at 5:15 pm - this is a lottery, NOT first come/first serve, so arriving earlier will not increase your chances of being seen. °  °  °[facility] °919-537-3737 °Select option 1 for emergencies °  °Location: °[facility], [address], [place] °Clinic Hours: °No walk-ins accepted - call the day before to schedule an appointment. °Check in times are 9:30 am and 1:30 pm. °Services: °Simple extractions, temporary fillings, pulpectomy/pulp debridement, uncomplicated abscess drainage. °Payment Options: °PAYMENT IS DUE AT THE TIME OF SERVICE.  Fee is usually $100-200, additional surgical procedures (e.g. abscess drainage) may be extra. °Cash, checks, Visa/MasterCard accepted.  Can file Medicaid if patient is covered for dental - patient should call case worker to check. °No discount for [facility] patients. °Best way to get seen: °MUST call the day before and get onto the schedule. Can usually be seen the next 1-2 days. No walk-ins accepted. °  °  °[facility] °919-933-9087 °  °Location: °[facility], [address], [place] °Clinic Hours: °M, W, Th, F 8am or 1:30pm, Tues 9a or 1:30 - first come/first served. °Services: °Simple extractions, temporary fillings, uncomplicated abscess drainage.  You do not need to be an [place] resident. °Payment Options: °PAYMENT IS DUE AT THE TIME OF SERVICE. °Dental insurance, otherwise sliding scale - bring proof of income or support. °Depending on income and treatment needed, cost is usually $50-200. °Best way to get seen: °Arrive early as it is first come/first served. °  °  °  [facility] °[phone] °  °Location: °[address] °Clinic Hours: °Mon-Thu 8a-5p °Services: °Most basic dental services including  extractions and fillings. °Payment Options: °PAYMENT IS DUE AT THE TIME OF SERVICE. °Sliding scale, up to 50% off - bring proof if income or support. °Medicaid with dental option accepted. °Best way to get seen: °Call to schedule an appointment, can usually be seen within 2 weeks OR they will try to see walk-ins - show up at 8a or 2p (you may have to wait). °  °  °[facility] °919-245-2435 °[place] RESIDENTS ONLY °  °Location: °[facility], [address], [place], [place] [address] °Clinic Hours: By appointment only. °Monday - Thursday 8am-5pm, Friday 8am-12pm °Services: Cleanings, fillings, extractions. °Payment Options: °PAYMENT IS DUE AT THE TIME OF SERVICE. °Cash, Visa or MasterCard. Sliding scale - $30 minimum per service. °Best way to get seen: °Come in to office, complete packet and make an appointment - need proof of income °or support monies for each household member and proof of [place] residence. °Usually takes about a month to get in. °  °  °[facility] °[phone] °  °Location: °[address] [place] °Clinic Hours: Walk-in Urgent Care Dental Services are offered Monday-Friday mornings only. °The numbers of emergencies accepted daily is limited to the number of °providers available. °Maximum 15 - Mondays, Wednesdays & Thursdays °Maximum 10 - Tuesdays & Fridays °Services: °You do not need to be a [place] resident to be seen for a dental emergency. °Emergencies are defined as pain, swelling, abnormal bleeding, or dental trauma. Walkins will receive x-rays if needed. °NOTE: Dental cleaning is not an emergency. °Payment Options: °PAYMENT IS DUE AT THE TIME OF SERVICE. °Minimum co-pay is $40.00 for uninsured patients. °Minimum co-pay is $3.00 for Medicaid with dental coverage. °Dental Insurance is accepted and must be presented at time of visit. °Medicare does not cover dental. °Forms of payment: Cash, credit card,  checks. °Best way to get seen: °If not previously registered with the clinic, walk-in dental registration begins at 7:15 am and is on a first come/first serve basis. °If previously registered with the clinic, call to make an appointment. °  °  °[facility] °919-776-4359 °[place] RESIDENTS ONLY °  °Location: °[address], [place], [place] °Clinic Hours: °Mon-Thu 10a-2p °Services: Extractions only! °Payment Options: °FREE (donations accepted) - bring proof of income or support °Best way to get seen: °Call and schedule an appointment OR come at 8am on the 1st Monday of every month (except for holidays) when it is first come/first served. °  °  °[facility] °[phone] °  °Location: °[address], [place] °Clinic Hours: °Friday mornings °Services, Payment Options, Best way to get seen: °Call for info ° °

## 2014-10-18 LAB — POCT PREGNANCY, URINE: Preg Test, Ur: NEGATIVE

## 2014-10-21 ENCOUNTER — Ambulatory Visit: Payer: Self-pay

## 2014-11-02 ENCOUNTER — Ambulatory Visit: Payer: Self-pay

## 2014-11-03 ENCOUNTER — Ambulatory Visit: Payer: Self-pay | Admitting: Internal Medicine

## 2014-11-30 ENCOUNTER — Ambulatory Visit: Payer: Self-pay

## 2014-12-23 ENCOUNTER — Other Ambulatory Visit: Payer: Self-pay

## 2014-12-28 ENCOUNTER — Ambulatory Visit: Payer: Self-pay

## 2015-01-20 ENCOUNTER — Other Ambulatory Visit: Payer: Self-pay

## 2015-01-20 LAB — CBC AND DIFFERENTIAL
HEMATOCRIT: 39 % (ref 36–46)
HEMOGLOBIN: 12.3 g/dL (ref 12.0–16.0)
NEUTROS ABS: 4 /uL
Platelets: 230 10*3/uL (ref 150–399)
WBC: 6.9 10*3/mL

## 2015-01-20 LAB — HEPATIC FUNCTION PANEL
ALT: 7 U/L (ref 7–35)
AST: 10 U/L — AB (ref 13–35)
Alkaline Phosphatase: 69 U/L (ref 25–125)
Bilirubin, Total: 0.2 mg/dL

## 2015-01-20 LAB — BASIC METABOLIC PANEL
BUN: 13 mg/dL (ref 4–21)
Creatinine: 0.8 mg/dL (ref 0.5–1.1)
GLUCOSE: 95 mg/dL
POTASSIUM: 3.9 mmol/L (ref 3.4–5.3)
SODIUM: 144 mmol/L (ref 137–147)

## 2015-01-20 LAB — TSH: TSH: 1.22 u[IU]/mL (ref 0.41–5.90)

## 2015-01-20 LAB — LIPID PANEL
CHOLESTEROL: 116 mg/dL (ref 0–200)
HDL: 38 mg/dL (ref 35–70)
LDL CALC: 68 mg/dL
Triglycerides: 50 mg/dL (ref 40–160)

## 2015-01-20 LAB — MICROALBUMIN, URINE: Microalb, Ur: 57.4

## 2015-01-20 LAB — HEMOGLOBIN A1C: Hemoglobin A1C: 7.1

## 2015-01-26 ENCOUNTER — Ambulatory Visit: Payer: Self-pay | Admitting: Internal Medicine

## 2015-02-13 DIAGNOSIS — E669 Obesity, unspecified: Secondary | ICD-10-CM

## 2015-02-13 HISTORY — DX: Obesity, unspecified: E66.9

## 2015-02-13 NOTE — L&D Delivery Note (Addendum)
Delivery Note 34yo [id number] at 37+1wks with didi TIUP who presented with PROM this morning at 0630. She was 4cm on admission, and pitocin started on arrival. She progressed to fully dilated and was moved to the OR for delivery.     Karie MainlandDorsey, Boy Serenah [[id number]][[id number]]  At 3:58 PM a viable and healthy female "L'Darius" was delivered via Vaginal, Spontaneous Delivery (Presentation: LOA ).  APGAR: 8, 9; weight 4 lb 15.2 oz (2245 g).  Nuchal cord x1, reduced on the perineum. Placenta status: intact with 3 vc. Delayed cord clamping x60sec. Baby to maternal abdomen.  Anesthesia:  Epidural Episiotomy:  none Lacerations:  none Suture Repair: none Est. Blood Loss (mL): 200  Just after Baby A delivered, Baby B's head was supported and his membranes carefully ruptured. His fluid was clear and his head was settled into the pelvic with manual support. During the next hour, He developed deep variable decels with each contraction. A vacuum was placed on the table but not required.    Smiley HousemanDorsey, BoyB Ronda [[id number]][[id number]]  At 5:40 PM a viable and healthy female "N'vadis" was delivered via  (Presentation: ROP).  APGAR: 8/8 weight 6lb 10oz 3000g Placenta status: spontaneous, intact  Cord:  2 VC .  He was vigorous. Delayed cord clamping x60 sec. Placed on maternal abdomen.   Anesthesia:  Epidural Episiotomy:  none Lacerations:  none Suture Repair: none Est. Blood Loss (mL): 200  Spontaneous placenta delivery. 40u postpartum pit given. Firm fundus.  Mom to postpartum.   Baby A to Couplet care / Skin to Skin.   Baby B to Couplet care / Skin to Skin.  Christeen DouglasBEASLEY, Aaron Boeh 10/07/2015, 6:16 PM   Pregnancy complicated by: 1. DiDi TIUP - fetal echo 06/30/15 wnl x2 2. Hx of IUFD, preterm loss and 2 SAB 3. Morbid obesity 4. Pre-pregnancy Type 2 DM: currently insulin needs: - am 54 of N and 24 of R - Lunch 24 of R - Dinner 24 of R - qhs 38 of N On admission, her FBG was 149  5. 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  Abnormal MSAFP- Sono by MFM neg for ONTD and gastroschisis. Weekly testing from 28 wks with planned delivery by 37wks 7. Hx of multiple pregnancy loss, including IUFD 8. Rubella non-immune

## 2015-03-01 ENCOUNTER — Ambulatory Visit: Payer: Self-pay

## 2015-03-01 DIAGNOSIS — E119 Type 2 diabetes mellitus without complications: Secondary | ICD-10-CM

## 2015-03-10 ENCOUNTER — Ambulatory Visit: Payer: Self-pay | Admitting: Ophthalmology

## 2015-03-16 ENCOUNTER — Emergency Department
Admission: EM | Admit: 2015-03-16 | Discharge: 2015-03-16 | Disposition: A | Discharge status: Home / Self Care | Payer: Medicaid Other | Attending: Emergency Medicine | Admitting: Emergency Medicine

## 2015-03-16 ENCOUNTER — Encounter: Payer: Self-pay | Admitting: Emergency Medicine

## 2015-03-16 ENCOUNTER — Emergency Department: Payer: Medicaid Other

## 2015-03-16 DIAGNOSIS — A599 Trichomoniasis, unspecified: Secondary | ICD-10-CM

## 2015-03-16 DIAGNOSIS — O24111 Pre-existing diabetes mellitus, type 2, in pregnancy, first trimester: Secondary | ICD-10-CM | POA: Insufficient documentation

## 2015-03-16 DIAGNOSIS — E669 Obesity, unspecified: Secondary | ICD-10-CM | POA: Insufficient documentation

## 2015-03-16 DIAGNOSIS — O468X1 Other antepartum hemorrhage, first trimester: Secondary | ICD-10-CM

## 2015-03-16 DIAGNOSIS — Z791 Long term (current) use of non-steroidal anti-inflammatories (NSAID): Secondary | ICD-10-CM | POA: Diagnosis not present

## 2015-03-16 DIAGNOSIS — O30041 Twin pregnancy, dichorionic/diamniotic, first trimester: Secondary | ICD-10-CM

## 2015-03-16 DIAGNOSIS — O2341 Unspecified infection of urinary tract in pregnancy, first trimester: Secondary | ICD-10-CM | POA: Diagnosis not present

## 2015-03-16 DIAGNOSIS — Z792 Long term (current) use of antibiotics: Secondary | ICD-10-CM | POA: Diagnosis not present

## 2015-03-16 DIAGNOSIS — Z880 Allergy status to penicillin: Secondary | ICD-10-CM | POA: Insufficient documentation

## 2015-03-16 DIAGNOSIS — O99211 Obesity complicating pregnancy, first trimester: Secondary | ICD-10-CM | POA: Diagnosis not present

## 2015-03-16 DIAGNOSIS — O209 Hemorrhage in early pregnancy, unspecified: Secondary | ICD-10-CM | POA: Insufficient documentation

## 2015-03-16 DIAGNOSIS — A5901 Trichomonal vulvovaginitis: Secondary | ICD-10-CM | POA: Diagnosis not present

## 2015-03-16 DIAGNOSIS — O418X10 Other specified disorders of amniotic fluid and membranes, first trimester, not applicable or unspecified: Secondary | ICD-10-CM | POA: Insufficient documentation

## 2015-03-16 DIAGNOSIS — O98811 Other maternal infectious and parasitic diseases complicating pregnancy, first trimester: Secondary | ICD-10-CM | POA: Insufficient documentation

## 2015-03-16 DIAGNOSIS — N939 Abnormal uterine and vaginal bleeding, unspecified: Secondary | ICD-10-CM

## 2015-03-16 DIAGNOSIS — Z3A08 8 weeks gestation of pregnancy: Secondary | ICD-10-CM | POA: Insufficient documentation

## 2015-03-16 DIAGNOSIS — N390 Urinary tract infection, site not specified: Secondary | ICD-10-CM

## 2015-03-16 LAB — CHLAMYDIA/NGC RT PCR (ARMC ONLY)
Chlamydia Tr: NOT DETECTED
N gonorrhoeae: NOT DETECTED

## 2015-03-16 LAB — URINALYSIS COMPLETE WITH MICROSCOPIC (ARMC ONLY)
Bilirubin Urine: NEGATIVE
Glucose, UA: NEGATIVE mg/dL
NITRITE: NEGATIVE
PH: 5 (ref 5.0–8.0)
PROTEIN: 30 mg/dL — AB
SPECIFIC GRAVITY, URINE: 1.025 (ref 1.005–1.030)

## 2015-03-16 LAB — WET PREP, GENITAL
CLUE CELLS WET PREP: NONE SEEN
SPERM: NONE SEEN
YEAST WET PREP: NONE SEEN

## 2015-03-16 LAB — ABO/RH: ABO/RH(D): A POS

## 2015-03-16 LAB — HCG, QUANTITATIVE, PREGNANCY: hCG, Beta Chain, Quant, S: 168676 m[IU]/mL — ABNORMAL HIGH (ref <5)

## 2015-03-16 MED ORDER — NITROFURANTOIN MONOHYD MACRO 100 MG PO CAPS
ORAL_CAPSULE | ORAL | Status: AC
  Administered 2015-03-16: 100 mg via ORAL
  Filled 2015-03-16: qty 1

## 2015-03-16 MED ORDER — METRONIDAZOLE 500 MG PO TABS
ORAL_TABLET | ORAL | Status: AC
  Administered 2015-03-16: 2000 mg via ORAL
  Filled 2015-03-16: qty 4

## 2015-03-16 MED ORDER — ONDANSETRON 4 MG PO TBDP
ORAL_TABLET | ORAL | Status: AC
  Administered 2015-03-16: 4 mg via ORAL
  Filled 2015-03-16: qty 1

## 2015-03-16 MED ORDER — ONDANSETRON 4 MG PO TBDP
4.0000 mg | ORAL_TABLET | Freq: Once | ORAL | Status: AC
  Administered 2015-03-16: 4 mg via ORAL

## 2015-03-16 MED ORDER — NITROFURANTOIN MONOHYD MACRO 100 MG PO CAPS
100.0000 mg | ORAL_CAPSULE | Freq: Once | ORAL | Status: AC
  Administered 2015-03-16: 100 mg via ORAL

## 2015-03-16 MED ORDER — METRONIDAZOLE 500 MG PO TABS
2000.0000 mg | ORAL_TABLET | Freq: Once | ORAL | Status: AC
  Administered 2015-03-16: 2000 mg via ORAL

## 2015-03-16 MED ORDER — NITROFURANTOIN MONOHYD MACRO 100 MG PO CAPS: 100.0000 mg | ORAL_CAPSULE | Freq: Two times a day (BID) | ORAL | Status: DC

## 2015-03-16 NOTE — Discharge Instructions (Signed)
Please return to the emergency department if you develop severe pain, increased vaginal bleeding, lightheadedness or fainting, or any other symptoms concerning to you.

## 2015-03-16 NOTE — ED Provider Notes (Signed)
[facility] Emergency Department Provider Note  ____________________________________________  Time seen: Approximately 7:54 PM  I have reviewed the triage vital signs and the nursing notes.   HISTORY  Chief Complaint Vaginal Bleeding    HPI Megan Stanton is a 35 y.o. female G5 P0 A4 approximately [redacted] weeks pregnant by LMP with a history of diabetes presenting with vaginal bleeding.Patient reports that she went to work this morning and had a small amount of "light blood" on her underwear. She had some additional vaginal spotting later today. No abdominal cramping, lightheadedness or shortness of breath. No abdominal pain. No fever, or change in vaginal discharge. Last sexual intercourse was greater than 3 weeks ago. The patient has not yet established OB care.  No history of treatment for gonorrhea or chlamydia. Patient OB history: First pregnancy resulting in fetal demise from nuchal cord; second and third pregnancies were early miscarriages between 6 and 12 weeks; fourth pregnancy was second trimester fetal demise with decreased amniotic fluid. She reports significant complications from her diabetes during pregnancy.  Past Medical History  Diagnosis Date  . Diabetes mellitus 2005    type 2    There are no active problems to display for this patient.   Past Surgical History  Procedure Laterality Date  . Dilation and curettage of uterus      Current Outpatient Rx  Name  Route  Sig  Dispense  Refill  . clindamycin (CLEOCIN) 150 MG capsule   Oral   Take 2 capsules (300 mg total) by mouth 3 (three) times daily.   60 capsule   0   . ibuprofen (ADVIL,MOTRIN) 800 MG tablet   Oral   Take 1 tablet (800 mg total) by mouth 3 (three) times daily.   30 tablet   0   . nitrofurantoin, macrocrystal-monohydrate, (MACROBID) 100 MG capsule   Oral   Take 1 capsule (100 mg total) by mouth 2 (two) times daily.   14 capsule   0   . traMADol (ULTRAM) 50 MG  tablet   Oral   Take 1 tablet (50 mg total) by mouth every 6 (six) hours as needed.   8 tablet   0     Allergies Penicillins  No family history on file.  Social History Social History  Substance Use Topics  . Smoking status: Never Smoker   . Smokeless tobacco: None  . Alcohol Use: No    Review of Systems Constitutional: No fever/chills. No lightheadedness or syncope. Eyes: No visual changes. ENT: No sore throat. Cardiovascular: Denies chest pain, palpitations. Respiratory: Denies shortness of breath.  No cough. Gastrointestinal: No abdominal pain.  No nausea, no vomiting.  No diarrhea.  No constipation. Genitourinary: Negative for dysuria. Positive vaginal bleeding  Musculoskeletal: Negative for back pain. Skin: Negative for rash. Neurological: Negative for headaches, focal weakness or numbness.  10-point ROS otherwise negative.  ____________________________________________   PHYSICAL EXAM:  VITAL SIGNS: ED Triage Vitals  Enc Vitals Group     BP 03/16/15 1825 139/86 mmHg     Pulse Rate 03/16/15 1825 92     Resp 03/16/15 1825 18     Temp 03/16/15 1825 99.1 F (37.3 C)     Temp Source 03/16/15 1825 Oral     SpO2 03/16/15 1825 98 %     Weight 03/16/15 1825 274 lb (124.286 kg)     Height 03/16/15 1825  (1.753 m)     Head Cir --      Peak Flow --  Pain Score --      Pain Loc --      Pain Edu? --      Excl. in GC? --     Constitutional: Alert and oriented. Well appearing and in no acute distress. Answer question appropriately. Eyes: Conjunctivae are normal.  EOMI. Head: Atraumatic. Nose: No congestion/rhinnorhea. Mouth/Throat: Mucous membranes are moist.  Neck: No stridor.  Supple.   Cardiovascular: Normal rate, regular rhythm. No murmurs, rubs or gallops.  Respiratory: Normal respiratory effort.  No retractions. Lungs CTAB.  No wheezes, rales or ronchi. Gastrointestinal: Obese. Soft and nontender. No distention. No peritoneal  signs. Genitourinary: Normal-appearing external genitalia without lesions. Normal vaginal exam with minimal bloody discharge, normal-appearing cervix, normal vaginal wall tissue. Bimanual exam is negative for CMT, adnexal tenderness to palpation, no palpable masses.  External os is open but internal os is closed. Musculoskeletal: No LE edema.  Neurologic:  Normal speech and language. No gross focal neurologic deficits are appreciated.  Skin:  Skin is warm, dry and intact. No rash noted. Psychiatric: Mood and affect are normal. Speech and behavior are normal.  Normal judgement.  ____________________________________________   LABS (all labs ordered are listed, but only abnormal results are displayed)  Labs Reviewed  WET PREP, GENITAL - Abnormal; Notable for the following:    Trich, Wet Prep PRESENT (*)    WBC, Wet Prep HPF POC MODERATE (*)    All other components within normal limits  HCG, QUANTITATIVE, PREGNANCY - Abnormal; Notable for the following:    hCG, Beta Francene Finders [id number] (*)    All other components within normal limits  URINALYSIS COMPLETEWITH MICROSCOPIC ([facility] ONLY) - Abnormal; Notable for the following:    Color, Urine RED (*)    APPearance CLOUDY (*)    Ketones, ur 1+ (*)    Hgb urine dipstick 3+ (*)    Protein, ur 30 (*)    Leukocytes, UA 3+ (*)    Bacteria, UA RARE (*)    Squamous Epithelial / LPF 6-30 (*)    All other components within normal limits  CHLAMYDIA/NGC RT PCR ([facility] ONLY)  URINE CULTURE  CBC  BASIC METABOLIC PANEL  ABO/RH   ____________________________________________  EKG  Not indicated ____________________________________________  RADIOLOGY  US Ob Comp Less 14 Wks  03/16/2015  CLINICAL DATA:  35 year old pregnant patient with vaginal bleeding for 1 day. EXAM: OBSTETRIC <14 WK [place] AND TRANSVAGINAL OB US TECHNIQUE: Both transabdominal and transvaginal ultrasound examinations were performed for complete evaluation of the gestation  as well as the maternal uterus, adnexal regions, and pelvic cul-de-sac. Transvaginal technique was performed to assess early pregnancy. COMPARISON:  OB ultrasound 03/11/2012. FINDINGS: Twin Gestation: Gestation A - Intrauterine gestational sac: Ovoid shaped gestational sac in the fundal portion of the endometrial canal. Yolk sac:  Present. Embryo:  Present. Cardiac Activity: Present. Heart Rate: 175  bpm CRL:  18.3  mm   8 w   2 d                  [place] EDC: 10/24/2015. Gestation B - Intrauterine gestational sac: Ovoid shaped gestational sac in the fundal portion of the endometrial canal. Yolk sac:  Present. Embryo:  Present. Cardiac Activity: Present. Heart Rate: 169  bpm CRL:  17.5  mm 8 w 2 d                  [place] EDC: 10/24/2015 Subchorionic hemorrhage: Small to moderate volume (1.9 x 0.6 x 1.7 cm) of  heterogeneously hypoechoic material adjacent to the gestational sacs, compatible with subchorionic hemorrhage. Maternal uterus/adnexae: There appears to be 2 separate amnions and 2 separate chorions. Right ovary could not be visualized secondary to overlying bowel. Left ovary is normal in appearance with probable degenerating corpus luteum cyst. No free fluid in the cul-de-sac. IMPRESSION: 1. Dichorionic twin pregnancy with 2 viable fetuses, with estimated gestational age of [redacted] weeks and 2 days, and normal fetal heart rates, as above. 2. Small to moderate subchorionic hemorrhage. 3. Right ovary cannot be visualized secondary to bowel gas. Probable degenerating corpus luteum cyst in the left ovary. Electronically Signed   By: Trudie Reed M.D.   On: 03/16/2015 21:04   US Ob Transvaginal  03/16/2015  CLINICAL DATA:  35 year old pregnant patient with vaginal bleeding for 1 day. EXAM: OBSTETRIC <14 WK [place] AND TRANSVAGINAL OB US TECHNIQUE: Both transabdominal and transvaginal ultrasound examinations were performed for complete evaluation of the gestation as well as the maternal uterus, adnexal regions, and pelvic  cul-de-sac. Transvaginal technique was performed to assess early pregnancy. COMPARISON:  OB ultrasound 03/11/2012. FINDINGS: Twin Gestation: Gestation A - Intrauterine gestational sac: Ovoid shaped gestational sac in the fundal portion of the endometrial canal. Yolk sac:  Present. Embryo:  Present. Cardiac Activity: Present. Heart Rate: 175  bpm CRL:  18.3  mm   8 w   2 d                  [place] EDC: 10/24/2015. Gestation B - Intrauterine gestational sac: Ovoid shaped gestational sac in the fundal portion of the endometrial canal. Yolk sac:  Present. Embryo:  Present. Cardiac Activity: Present. Heart Rate: 169  bpm CRL:  17.5  mm 8 w 2 d                  [place] EDC: 10/24/2015 Subchorionic hemorrhage: Small to moderate volume (1.9 x 0.6 x 1.7 cm) of heterogeneously hypoechoic material adjacent to the gestational sacs, compatible with subchorionic hemorrhage. Maternal uterus/adnexae: There appears to be 2 separate amnions and 2 separate chorions. Right ovary could not be visualized secondary to overlying bowel. Left ovary is normal in appearance with probable degenerating corpus luteum cyst. No free fluid in the cul-de-sac. IMPRESSION: 1. Dichorionic twin pregnancy with 2 viable fetuses, with estimated gestational age of [redacted] weeks and 2 days, and normal fetal heart rates, as above. 2. Small to moderate subchorionic hemorrhage. 3. Right ovary cannot be visualized secondary to bowel gas. Probable degenerating corpus luteum cyst in the left ovary. Electronically Signed   By: Trudie Reed M.D.   On: 03/16/2015 21:04    ____________________________________________   PROCEDURES  Procedure(s) performed: None  Critical Care performed: No ____________________________________________   INITIAL IMPRESSION / ASSESSMENT AND PLAN / ED COURSE  Pertinent labs & imaging results that were available during my care of the patient were reviewed by me and considered in my medical decision making (see chart for details).  35  y.o. G5 P0A4 with a complicated history of early and late miscarriages and fetal demise presenting with vaginal spotting. The patient has stable vital signs and a reassuring abdominal exam. The differential includes threatened AB, rule out ectopic pregnancy. We will evaluate for UTI or vaginal infection.  ----------------------------------------- 9:52 PM on 03/16/2015 -----------------------------------------  The patient is positive for Trichomonas I will treat her for that.  The patient's ultrasound shows a dichorionic twin pregnancy with 2 viable fetuses with estimated gestational age of [redacted] weeks and 2 days,  as well as normal fetal heart rates. She does have a small to moderate subchorionic hemorrhage. This may be causing her vaginal bleeding. I would talk to the [facility] on-call, to establish close follow-up care for the patient. I have given the patient bleeding and return precautions. She understands discharge instructions as well as follow-up instructions.  ____________________________________________  FINAL CLINICAL IMPRESSION(S) / ED DIAGNOSES  Final diagnoses:  Dichorionic diamniotic twin pregnancy in first trimester  Vaginal bleeding  Subchorionic hemorrhage, first trimester  Trichomonas infection  UTI (lower urinary tract infection)      NEW MEDICATIONS STARTED DURING THIS VISIT:  New Prescriptions   NITROFURANTOIN, MACROCRYSTAL-MONOHYDRATE, (MACROBID) 100 MG CAPSULE    Take 1 capsule (100 mg total) by mouth 2 (two) times daily.     Rockne Menghini, MD 03/16/15 2210

## 2015-03-16 NOTE — ED Notes (Signed)
States she is approx 8 weeks preg and developed somce vaginal spotting since yesterday

## 2015-03-19 LAB — URINE CULTURE

## 2015-03-20 NOTE — Progress Notes (Addendum)
ED Culture Results   Allergies: Penicllin (unknown rxn per chart) Visit Date: 03/16/15  Chief Complaint: UTI, vaginal bleeding Culture Type: Urine culture Culture Results: 100 k CFU Proteus Patient is pregnant Original Abx given: Nitrofurantoin  Original Abx Resistant Recommended Abx: Keflex 500 mg PO BID x 10 days. Attempted Calling  patient to find out if she had taken any cephlasporins before; however patient has not returned phone call. Left VM for her to call at her earliest convenience.   ED Physician: Edwena Bunde, MD  Contacted Patient: Yes multiple calls attempted. Awaiting CB, left VM  Prescription Called into: See above.   Demetrius Charity, PharmD, BCPS Clinical Pharmacist

## 2015-03-20 NOTE — Progress Notes (Signed)
Spoke with patient this afternoon. Patient clarified with her mother what her penicillin reaction was in the past and reports a rash. Patient denies any SOB/facial swelling. Extremely low risk of cross-reactivity with cephalosporin  [facility] [place] [facility]30 on 03/20/15.  Cephalexin 500 mg PO BID x 10 days No refills MD: Vito Backers, PharmD Clinical Pharmacist 03/20/15 10:16 PM

## 2015-03-25 LAB — OB RESULTS CONSOLE ABO/RH: RH TYPE: POSITIVE

## 2015-03-25 LAB — OB RESULTS CONSOLE RPR: RPR: NONREACTIVE

## 2015-03-25 LAB — OB RESULTS CONSOLE RUBELLA ANTIBODY, IGM: Rubella: IMMUNE

## 2015-03-25 LAB — OB RESULTS CONSOLE VARICELLA ZOSTER ANTIBODY, IGG: Varicella: IMMUNE

## 2015-03-25 LAB — OB RESULTS CONSOLE HEPATITIS B SURFACE ANTIGEN: HEP B S AG: NEGATIVE

## 2015-03-28 ENCOUNTER — Other Ambulatory Visit: Payer: Self-pay | Admitting: Obstetrics and Gynecology

## 2015-03-28 DIAGNOSIS — Z3491 Encounter for supervision of normal pregnancy, unspecified, first trimester: Secondary | ICD-10-CM

## 2015-04-15 ENCOUNTER — Emergency Department
Admission: EM | Admit: 2015-04-15 | Discharge: 2015-04-15 | Disposition: A | Discharge status: Home / Self Care | Payer: Medicaid Other | Attending: Emergency Medicine | Admitting: Emergency Medicine

## 2015-04-15 ENCOUNTER — Encounter: Payer: Self-pay | Admitting: Emergency Medicine

## 2015-04-15 DIAGNOSIS — O9989 Other specified diseases and conditions complicating pregnancy, childbirth and the puerperium: Secondary | ICD-10-CM | POA: Diagnosis present

## 2015-04-15 DIAGNOSIS — N760 Acute vaginitis: Secondary | ICD-10-CM

## 2015-04-15 DIAGNOSIS — Z3A11 11 weeks gestation of pregnancy: Secondary | ICD-10-CM | POA: Insufficient documentation

## 2015-04-15 DIAGNOSIS — Z792 Long term (current) use of antibiotics: Secondary | ICD-10-CM | POA: Diagnosis not present

## 2015-04-15 DIAGNOSIS — O24111 Pre-existing diabetes mellitus, type 2, in pregnancy, first trimester: Secondary | ICD-10-CM | POA: Diagnosis not present

## 2015-04-15 DIAGNOSIS — Z791 Long term (current) use of non-steroidal anti-inflammatories (NSAID): Secondary | ICD-10-CM | POA: Insufficient documentation

## 2015-04-15 DIAGNOSIS — E119 Type 2 diabetes mellitus without complications: Secondary | ICD-10-CM | POA: Insufficient documentation

## 2015-04-15 DIAGNOSIS — O2311 Infections of bladder in pregnancy, first trimester: Secondary | ICD-10-CM | POA: Diagnosis not present

## 2015-04-15 DIAGNOSIS — B9689 Other specified bacterial agents as the cause of diseases classified elsewhere: Secondary | ICD-10-CM

## 2015-04-15 DIAGNOSIS — Z880 Allergy status to penicillin: Secondary | ICD-10-CM | POA: Insufficient documentation

## 2015-04-15 DIAGNOSIS — Z3491 Encounter for supervision of normal pregnancy, unspecified, first trimester: Secondary | ICD-10-CM

## 2015-04-15 LAB — URINALYSIS COMPLETE WITH MICROSCOPIC (ARMC ONLY)
Bilirubin Urine: NEGATIVE
Glucose, UA: NEGATIVE mg/dL
HGB URINE DIPSTICK: NEGATIVE
KETONES UR: NEGATIVE mg/dL
Nitrite: NEGATIVE
PH: 6 (ref 5.0–8.0)
PROTEIN: NEGATIVE mg/dL
SPECIFIC GRAVITY, URINE: 1.028 (ref 1.005–1.030)

## 2015-04-15 LAB — WET PREP, GENITAL
Sperm: NONE SEEN
Trich, Wet Prep: NONE SEEN
YEAST WET PREP: NONE SEEN

## 2015-04-15 LAB — CHLAMYDIA/NGC RT PCR (ARMC ONLY)
CHLAMYDIA TR: NOT DETECTED
N GONORRHOEAE: NOT DETECTED

## 2015-04-15 MED ORDER — METRONIDAZOLE 500 MG PO TABS: 500.0000 mg | ORAL_TABLET | Freq: Two times a day (BID) | ORAL | Status: DC

## 2015-04-15 MED ORDER — METRONIDAZOLE 500 MG PO TABS
500.0000 mg | ORAL_TABLET | Freq: Once | ORAL | Status: AC
  Administered 2015-04-15: 500 mg via ORAL
  Filled 2015-04-15: qty 1

## 2015-04-15 NOTE — Discharge Instructions (Signed)
First Trimester of Pregnancy The first trimester of pregnancy is from week 1 until the end of week 12 (months 1 through 3). During this time, your baby will begin to develop inside you. At 6-8 weeks, the eyes and face are formed, and the heartbeat can be seen on ultrasound. At the end of 12 weeks, all the baby's organs are formed. Prenatal care is all the medical care you receive before the birth of your baby. Make sure you get good prenatal care and follow all of your doctor's instructions. HOME CARE  Medicines  Take medicine only as told by your doctor. Some medicines are safe and some are not during pregnancy.  Take your prenatal vitamins as told by your doctor.  Take medicine that helps you poop (stool softener) as needed if your doctor says it is okay. Diet  Eat regular, healthy meals.  Your doctor will tell you the amount of weight gain that is right for you.  Avoid raw meat and uncooked cheese.  If you feel sick to your stomach (nauseous) or throw up (vomit):  Eat 4 or 5 small meals a day instead of 3 large meals.  Try eating a few soda crackers.  Drink liquids between meals instead of during meals.  If you have a hard time pooping (constipation):  Eat high-fiber foods like fresh vegetables, fruit, and whole grains.  Drink enough fluids to keep your pee (urine) clear or pale yellow. Activity and Exercise  Exercise only as told by your doctor. Stop exercising if you have cramps or pain in your lower belly (abdomen) or low back.  Try to avoid standing for long periods of time. Move your legs often if you must stand in one place for a long time.  Avoid heavy lifting.  Wear low-heeled shoes. Sit and stand up straight.  You can have sex unless your doctor tells you not to. Relief of Pain or Discomfort  Wear a good support bra if your breasts are sore.  Take warm water baths (sitz baths) to soothe pain or discomfort caused by hemorrhoids. Use hemorrhoid cream if your  doctor says it is okay.  Rest with your legs raised if you have leg cramps or low back pain.  Wear support hose if you have puffy, bulging veins (varicose veins) in your legs. Raise (elevate) your feet for 15 minutes, 3-4 times a day. Limit salt in your diet. Prenatal Care  Schedule your prenatal visits by the twelfth week of pregnancy.  Write down your questions. Take them to your prenatal visits.  Keep all your prenatal visits as told by your doctor. Safety  Wear your seat belt at all times when driving.  Make a list of emergency phone numbers. The list should include numbers for family, friends, the hospital, and police and fire departments. General Tips  Ask your doctor for a referral to a local prenatal class. Begin classes no later than at the start of month 6 of your pregnancy.  Ask for help if you need counseling or help with nutrition. Your doctor can give you advice or tell you where to go for help.  Do not use hot tubs, steam rooms, or saunas.  Do not douche or use tampons or scented sanitary pads.  Do not cross your legs for long periods of time.  Avoid litter boxes and soil used by cats.  Avoid all smoking, herbs, and alcohol. Avoid drugs not approved by your doctor.  Do not use any tobacco products, including cigarettes,  chewing tobacco, and electronic cigarettes. If you need help quitting, ask your doctor. You may get counseling or other support to help you quit.  Visit your dentist. At home, brush your teeth with a soft toothbrush. Be gentle when you floss. GET HELP IF:  You are dizzy.  You have mild cramps or pressure in your lower belly.  You have a nagging pain in your belly area.  You continue to feel sick to your stomach, throw up, or have watery poop (diarrhea).  You have a bad smelling fluid coming from your vagina.  You have pain with peeing (urination).  You have increased puffiness (swelling) in your face, hands, legs, or ankles. GET HELP  RIGHT AWAY IF:   You have a fever.  You are leaking fluid from your vagina.  You have spotting or bleeding from your vagina.  You have very bad belly cramping or pain.  You gain or lose weight rapidly.  You throw up blood. It may look like coffee grounds.  You are around people who have [place] measles, fifth disease, or chickenpox.  You have a very bad headache.  You have shortness of breath.  You have any kind of trauma, such as from a fall or a car accident.   This information is not intended to replace advice given to you by your health care provider. Make sure you discuss any questions you have with your health care provider.   Document Released: 07/18/2007 Document Revised: 02/19/2014 Document Reviewed: 12/09/2012 Elsevier Interactive Patient Education 2016 Elsevier Inc.   Bacterial Vaginosis Bacterial vaginosis is a vaginal infection that occurs when the normal balance of bacteria in the vagina is disrupted. It results from an overgrowth of certain bacteria. This is the most common vaginal infection in women of childbearing age. Treatment is important to prevent complications, especially in pregnant women, as it can cause a premature delivery. CAUSES  Bacterial vaginosis is caused by an increase in harmful bacteria that are normally present in smaller amounts in the vagina. Several different kinds of bacteria can cause bacterial vaginosis. However, the reason that the condition develops is not fully understood. RISK FACTORS Certain activities or behaviors can put you at an increased risk of developing bacterial vaginosis, including:  Having a new sex partner or multiple sex partners.  Douching.  Using an intrauterine device (IUD) for contraception. Women do not get bacterial vaginosis from toilet seats, bedding, swimming pools, or contact with objects around them. SIGNS AND SYMPTOMS  Some women with bacterial vaginosis have no signs or symptoms. Common symptoms  include:  Grey vaginal discharge.  A fishlike odor with discharge, especially after sexual intercourse.  Itching or burning of the vagina and vulva.  Burning or pain with urination. DIAGNOSIS  Your health care provider will take a medical history and examine the vagina for signs of bacterial vaginosis. A sample of vaginal fluid may be taken. Your health care provider will look at this sample under a microscope to check for bacteria and abnormal cells. A vaginal pH test may also be done.  TREATMENT  Bacterial vaginosis may be treated with antibiotic medicines. These may be given in the form of a pill or a vaginal cream. A second round of antibiotics may be prescribed if the condition comes back after treatment. Because bacterial vaginosis increases your risk for sexually transmitted diseases, getting treated can help reduce your risk for chlamydia, gonorrhea, HIV, and herpes. HOME CARE INSTRUCTIONS   Only take over-the-counter or prescription medicines as directed  by your health care provider.  If antibiotic medicine was prescribed, take it as directed. Make sure you finish it even if you start to feel better.  Tell all sexual partners that you have a vaginal infection. They should see their health care provider and be treated if they have problems, such as a mild rash or itching.  During treatment, it is important that you follow these instructions:  Avoid sexual activity or use condoms correctly.  Do not douche.  Avoid alcohol as directed by your health care provider.  Avoid breastfeeding as directed by your health care provider. SEEK MEDICAL CARE IF:   Your symptoms are not improving after 3 days of treatment.  You have increased discharge or pain.  You have a fever. MAKE SURE YOU:   Understand these instructions.  Will watch your condition.  Will get help right away if you are not doing well or get worse. FOR MORE INFORMATION  Centers for Disease Control and  Prevention, Division of STD Prevention: SolutionApps.co.zawww.cdc.gov/std American Sexual Health Association (ASHA): www.ashastd.org    This information is not intended to replace advice given to you by your health care provider. Make sure you discuss any questions you have with your health care provider.   Document Released: 01/29/2005 Document Revised: 02/19/2014 Document Reviewed: 09/10/2012 Elsevier Interactive Patient Education [employer]2016 Elsevier Inc.

## 2015-04-15 NOTE — ED Notes (Signed)
Patient discharged to home per MD order. Patient in stable condition, and deemed medically cleared by ED provider for discharge. Discharge instructions reviewed with patient/family using "Teach Back"; verbalized understanding of medication education and administration, and information about follow-up care. Denies further concerns. ° °

## 2015-04-15 NOTE — ED Notes (Addendum)
Patient ambulatory to triage with steady gait, without difficulty or distress noted; pt reports [redacted]wks pregnant (twin pregnancy); c/o "vaginal moisture"; denies vag discharge; denies bleeding, denies pain[facility]; G5, no living children,

## 2015-04-15 NOTE — ED Provider Notes (Signed)
[facility] Emergency Department Provider Note  ____________________________________________  Time seen: Approximately 4:32 AM  I have reviewed the triage vital signs and the nursing notes.   HISTORY  Chief Complaint Vaginal Discharge    HPI Megan Stanton is a 35 y.o. female history of 4 previous pregnancies, currently pregnant.  Patient reports she is pregnant with twins. She states she is not having any pain, no bleeding, denies vaginal discharge but feels a "moist" sensation in the vagina for the last several days. She is unsure what this means. She denies any loss of fluid. Denies any pain, nausea, vomiting, fever or chills. Reports she feels fine and just she is unsure if she should feel "moist" while she was pregnant.   Past Medical History  Diagnosis Date  . Diabetes mellitus 2005    type 2    There are no active problems to display for this patient.   Past Surgical History  Procedure Laterality Date  . Dilation and curettage of uterus      Current Outpatient Rx  Name  Route  Sig  Dispense  Refill  . clindamycin (CLEOCIN) 150 MG capsule   Oral   Take 2 capsules (300 mg total) by mouth 3 (three) times daily.   60 capsule   0   . ibuprofen (ADVIL,MOTRIN) 800 MG tablet   Oral   Take 1 tablet (800 mg total) by mouth 3 (three) times daily.   30 tablet   0   . metroNIDAZOLE (FLAGYL) 500 MG tablet   Oral   Take 1 tablet (500 mg total) by mouth 2 (two) times daily.   14 tablet   0   . nitrofurantoin, macrocrystal-monohydrate, (MACROBID) 100 MG capsule   Oral   Take 1 capsule (100 mg total) by mouth 2 (two) times daily.   14 capsule   0   . traMADol (ULTRAM) 50 MG tablet   Oral   Take 1 tablet (50 mg total) by mouth every 6 (six) hours as needed.   8 tablet   0     Allergies Penicillins  No family history on file.  Social History Social History  Substance Use Topics  . Smoking status: Never Smoker   . Smokeless  tobacco: None  . Alcohol Use: No    Review of Systems Constitutional: No fever/chills Eyes: No visual changes. ENT: No sore throat. Cardiovascular: Denies chest pain. Respiratory: Denies shortness of breath. Gastrointestinal: No abdominal pain.  No nausea, no vomiting.   Genitourinary: Negative for dysuria. No frequent urination. Musculoskeletal: Negative for back pain. Skin: Negative for rash. Neurological: Negative for headaches, focal weakness or numbness.  10-point ROS otherwise negative.  ____________________________________________   PHYSICAL EXAM:  VITAL SIGNS: ED Triage Vitals  Enc Vitals Group     BP 04/15/15 0142 139/87 mmHg     Pulse Rate 04/15/15 0142 112     Resp 04/15/15 0142 18     Temp 04/15/15 0142 98.7 F (37.1 C)     Temp Source 04/15/15 0142 Oral     SpO2 04/15/15 0142 100 %     Weight 04/15/15 0142 264 lb (119.75 kg)     Height 04/15/15 0142  (1.753 m)     Head Cir --      Peak Flow --      Pain Score --      Pain Loc --      Pain Edu? --      Excl. in GC? --  Constitutional: Alert and oriented. Well appearing and in no acute distress. Eyes: Conjunctivae are normal. PERRL. EOMI. Head: Atraumatic. Nose: No congestion/rhinnorhea. Mouth/Throat: Mucous membranes are moist.   Neck: No stridor.   Cardiovascular: Normal rate, regular rhythm. Grossly normal heart sounds.   Respiratory: Normal respiratory effort.  No retractions. Lungs CTAB. Gastrointestinal: Soft and nontender. No distention.  Genitourinary exam performed with Juanetta. No cervical motion tenderness. Very scant amount of just thin white discharge. No obvious clear fluid, no bleeding. Essentially normal pelvic exam save some small amount of just thin white discharge. Musculoskeletal: No lower extremity tenderness nor edema.  Neurologic:  Normal speech and language. No gross focal neurologic deficits are appreciated. Skin:  Skin is warm, dry and intact. No rash  noted. Psychiatric: Mood and affect are normal. Speech and behavior are normal.  ____________________________________________   LABS (all labs ordered are listed, but only abnormal results are displayed)  Labs Reviewed  WET PREP, GENITAL - Abnormal; Notable for the following:    Clue Cells Wet Prep HPF POC PRESENT (*)    WBC, Wet Prep HPF POC FEW (*)    All other components within normal limits  URINALYSIS COMPLETEWITH MICROSCOPIC ([facility] ONLY) - Abnormal; Notable for the following:    Color, Urine YELLOW (*)    APPearance CLOUDY (*)    Leukocytes, UA 2+ (*)    Bacteria, UA RARE (*)    Squamous Epithelial / LPF 6-30 (*)    All other components within normal limits  CHLAMYDIA/NGC RT PCR ([facility] ONLY)  URINE CULTURE   ____________________________________________  EKG   ____________________________________________  RADIOLOGY   ____________________________________________   PROCEDURES  Procedure(s) performed: None  Critical Care performed: No  ____________________________________________   INITIAL IMPRESSION / ASSESSMENT AND PLAN / ED COURSE  Pertinent labs & imaging results that were available during my care of the patient were reviewed by me and considered in my medical decision making (see chart for details).  Patient is gravid, presents for evaluation of a "moist" sensation. Denies any abdominal pain or bleeding. She is stable. Her symptoms and clinical history of very reassuring. Wet prep does demonstrate some clue cells and review of literature suggests treatment with Flagyl twice a day for 7 days is most appropriate in this pregnant patient. I discussed close follow-up. She's had previous ultrasound confirming twin pregnancy intrauterine. She has no signs or symptoms to suggest bleeding, ectopic pregnancy, or other complication at this time.  Plan to discharge the patient home. Return precautions and follow-up with obstetrics via the health department or  [facility] where she also followed's advice. Patient agreeable. ____________________________________________   FINAL CLINICAL IMPRESSION(S) / ED DIAGNOSES  Final diagnoses:  Bacterial vaginosis  First trimester pregnancy      Sharyn CreamerMark Mattison Stuckey, MD 04/15/15 [phone]

## 2015-04-17 LAB — URINE CULTURE: SPECIAL REQUESTS: NORMAL

## 2015-04-18 ENCOUNTER — Ambulatory Visit
Admission: RE | Admit: 2015-04-18 | Discharge: 2015-04-18 | Disposition: A | Discharge status: Home / Self Care | Payer: Medicaid Other | Source: Ambulatory Visit | Attending: Maternal & Fetal Medicine | Admitting: Maternal & Fetal Medicine

## 2015-04-18 VITALS — BP 131/71 | HR 100 | Temp 98.0°F | Resp 18 | Wt 264.0 lb

## 2015-04-18 DIAGNOSIS — Z36 Encounter for antenatal screening of mother: Secondary | ICD-10-CM | POA: Diagnosis present

## 2015-04-18 DIAGNOSIS — Z3491 Encounter for supervision of normal pregnancy, unspecified, first trimester: Secondary | ICD-10-CM

## 2015-04-18 DIAGNOSIS — Z3A12 12 weeks gestation of pregnancy: Secondary | ICD-10-CM | POA: Insufficient documentation

## 2015-04-18 DIAGNOSIS — O30001 Twin pregnancy, unspecified number of placenta and unspecified number of amniotic sacs, first trimester: Secondary | ICD-10-CM | POA: Insufficient documentation

## 2015-04-18 DIAGNOSIS — O09521 Supervision of elderly multigravida, first trimester: Secondary | ICD-10-CM

## 2015-04-18 DIAGNOSIS — O30041 Twin pregnancy, dichorionic/diamniotic, first trimester: Secondary | ICD-10-CM

## 2015-04-18 HISTORY — DX: Obesity, unspecified: E66.9

## 2015-04-18 NOTE — Progress Notes (Signed)
Referring Provider:  Department, [place] [facility]* Length of Consultation: 40 minutes  [name] was referred to [facility] [place] for genetic counseling and first trimester screening.  She has a history of pregnancy loss and is considered to be of advanced maternal age (35 years old with twins).  The patient will be 35 years old at the time of delivery. She is pregnant with twins.  This note summarizes the information we discussed.    We explained that the chance of a chromosome abnormality increases with maternal age.  Chromosomes and examples of chromosome problems were reviewed.  Humans typically have 46 chromosomes in each cell, with half passed through each sperm and egg.  Any change in the number or structure of chromosomes can increase the risk of problems in the physical and mental development of a pregnancy.   Based upon age of the patient, the chance of any chromosome abnormality for each baby is 1 in 45.  The chance of Down syndrome, the most common chromosome problem associated with maternal age, was 1 in 70.  This chance is for each baby, given the presence of di-di twins.  The risk of chromosome problems is in addition to the 3% general population risk for birth defects and mental retardation.  The greatest chance, of course, is that the baby would be born in good health.  We discussed the following prenatal screening and testing options for this pregnancy:  First trimester screening, which can include nuchal translucency ultrasound screen and/or first trimester maternal serum marker screening.  The nuchal translucency has approximately an 70% detection rate for Down syndrome and can be positive for other chromosome abnormalities as well as heart defects.  When combined with a maternal serum marker screening in a twin gestation, the detection rate is up to 80% for Down syndrome and up to 90% for trisomy 18 and 13.     The chorionic villus sampling procedure is  available for first trimester chromosome analysis.  This involves the withdrawal of a small amount of chorionic villi (tissue from the developing placenta).  Risk of pregnancy loss is estimated to be approximately 1 in 100 to 1 in 50 (1% to 2%).  There is approximately a 1% (1 in 100) chance that the CVS chromosome results will be unclear.  Chorionic villi cannot be tested for neural tube defects.     Maternal serum marker screening, a blood test that measures pregnancy proteins, can provide risk assessments for Down syndrome and open neural tube defects (spina bifida, anencephaly). Because it does not directly examine the fetus, it cannot positively diagnose or rule out these problems.  Targeted ultrasound uses high frequency sound waves to create an image of the developing fetus.  An ultrasound is often recommended as a routine means of evaluating the pregnancy.  It is also used to screen for fetal anatomy problems (for example, a heart defect) that might be suggestive of a chromosomal or other abnormality.   Amniocentesis involves the removal of a small amount of amniotic fluid from the sac surrounding the fetus with the use of a thin needle inserted through the maternal abdomen and uterus.  Ultrasound guidance is used throughout the procedure.  Fetal cells from amniotic fluid are directly evaluated and > 99.5% of chromosome problems and > 98% of open neural tube defects can be detected. This procedure is generally performed after the 15th week of pregnancy.  The main risks to this procedure include complications leading to miscarriage is up  to 1 in 100 (1%) in a twin gestation.  We also reviewed the availability of cell free fetal DNA testing from maternal blood to determine whether or not the baby may have either Down syndrome, trisomy 65, or trisomy 40.  This test utilizes a maternal blood sample and DNA sequencing technology to isolate circulating cell free fetal DNA from maternal plasma.  The fetal  DNA can then be analyzed for DNA sequences that are derived from the three most common chromosomes involved in aneuploidy, chromosomes 13, 18, and 21.  If the overall amount of DNA is greater than the expected level for any of these chromosomes, aneuploidy is suspected.  While we do not consider it a replacement for invasive testing and karyotype analysis, a negative result from this testing would be reassuring, though not a guarantee of a normal chromosome complement for the baby.  An abnormal result is certainly suggestive of an abnormal chromosome complement, though we would still recommend CVS or amniocentesis to confirm any findings from this testing.  The lab states that the detection of these chromosome conditions in twins is comparable to that in a singleton pregnancy, however, if an abnormal result is obtained, it is not possible to determine which baby (or both) may be affected.  Similarly, for sex chromosome detection, if only X chromosome material is present, this would indicate two female babies.  If Y chromosome material is present, it is not possible to determine if one or both babies is female.  We obtained a detailed family history for the father of the pregnancy.  His part of the family history was unremarkable.  This is [name]'s first pregnancy with this partner.  The patient had genetic counseling following her last pregnancy in our [facility] office during which a detailed family tree was obtained for her.  She has a history of a term stillborn female in her first pregnancy, an 18 week delivery in her second, and an IUFD at 69 weeks in her third pregnancy. Chromosomal microarray was normal female in the last pregnancy, but no chromosome testing was available for the other losses.  She also had an evaluation for thrombophilia at [facility] which was normal.  Parental chromosome analysis was offered previously and declined.  The father of this pregnancy was not available today.  The patient reported that  her mother had one pregnancy loss, but no other family members with recurrent losses.  There were no other changes in the history reported since that prior genetic counseling visit, which was unremarkable for birth defects, mental retardation, recurrent pregnancy loss or known chromosome abnormalities.   [name] denied any other pregnancy complications thus far, as well as any drug, alcohol or tobacco use in this pregnancy. The patient is diabetes and is taking insulin, Folic acid, 81 mg baby aspirin and prenatal vitamins.  She is optimistic about the outcome of this pregnancy and is working hard to maintain good health.  After consideration of the options, the patient elected to proceed with an ultrasound and InformaSeq with XY.  An ultrasound was performed at the time of the visit.  The gestational age was consistent with 12 weeks.  Fetal anatomy could not be assessed due to early gestational age. We therefore recommend a detailed anatomy ultrasound at [redacted] weeks gestation.  Thank you for involving [place] in the care of this patient.  [name] was encouraged to call with questions or concerns.  We can be contacted at [phone].  Cherly Anderson, MS,  CGC

## 2015-04-25 ENCOUNTER — Encounter: Payer: Medicaid Other | Attending: Obstetrics and Gynecology | Admitting: Dietician

## 2015-04-25 ENCOUNTER — Telehealth: Payer: Self-pay | Admitting: Obstetrics and Gynecology

## 2015-04-25 VITALS — BP 124/66 | Ht 69.0 in | Wt 265.2 lb

## 2015-04-25 DIAGNOSIS — O24911 Unspecified diabetes mellitus in pregnancy, first trimester: Secondary | ICD-10-CM

## 2015-04-25 DIAGNOSIS — E119 Type 2 diabetes mellitus without complications: Secondary | ICD-10-CM | POA: Insufficient documentation

## 2015-04-25 LAB — INFORMASEQ(SM) WITH XY ANALYSIS
FETAL FRACTION (%): 14.5
FETAL NUMBER: 2
GESTATIONAL AGE AT COLLECTION: 13 wk
Sex Chromosome Result: DETECTED
WEIGHT: 264 [lb_av]

## 2015-04-25 NOTE — Progress Notes (Signed)
Diabetes Self-Management Education  Visit Type: First/Initial  Appt. Start Time: 1330 Appt. End Time: 1430  04/25/2015  [name], identified by name and date of birth, is a 35 y.o. female with a diagnosis of Diabetes: Type 2 ([facility] 10-27-15).   ASSESSMENT  Blood pressure 124/66, height 5\' 9"  (1.753 m), weight 265 lb 3.2 oz (120.294 kg), last menstrual period 01/20/2015, unknown if currently breastfeeding. Body mass index is 39.15 kg/(m^2). 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 12   Complications   Last HgB A1C per patient/outside source 7.2 %  03-25-15   How often do you check your blood sugar? 3-4 times/day-pt checked BG 2 hr pp during visit with results 75   Fasting Blood glucose range (mg/dL) 16-109;604-54070-129;130-179   Postprandial Blood glucose range (mg/dL) 98-119;147-829;562-13070-129;130-179;180-200   Have you had a dilated eye exam in the past 12 months? No  2015   Have you had a dental exam in the past 12 months? No  2013   Are you checking your feet? Yes   How many days per week are you checking your feet? 7   Dietary Intake   Breakfast --  usually eats breakfast 9-9:30am; c/o decreased appetite with nausea and some vomiting at times   Lunch --  occasionally skips lunch-meal times vary (pt works 1st and 3rd shifts  resulting in erratic sleeping and eating)   Snack (afternoon) --  eats fruit or vegetables for snacks   Dinner --  supper time varies due to work   [employer]ack (evening) --  eats fruits or vegetables during night if working 3rd shift   Beverage(s) --  drinks 3 glasses of fruit juice daily, 2-3 regular sodas daily and 2-3 diet sodas/day; drinks 0-1 glasses of water/day   Exercise   Exercise Type ADL's  walks 20 min. 2x/wk past wk.   How many days per week to you exercise? --  2   How many minutes per day do you exercise? --  20   Patient Education   Previous Diabetes Education Yes (please comment)   Disease state  --  discussed pathophysiology of type 2 diabetes and effects of pregnancy on BG's   Nutrition management  Role of diet in the treatment of diabetes and the relationship between the three main macronutrients and blood glucose level;Food label reading, portion sizes and measuring food.;Carbohydrate counting;Effects of alcohol on blood glucose and safety factors with consumption of alcohol.;Meal timing in regards to the patients' current diabetes medication.   Physical activity and exercise  Role of exercise on diabetes management, blood pressure control and cardiac health.   Medications Taught/reviewed insulin injection, site rotation, insulin storage and needle disposal.;Reviewed patients medication for diabetes, action, purpose, timing of dose and side effects.   Monitoring Purpose and frequency of SMBG.;Taught/discussed  recording of test results and interpretation of SMBG.;Identified appropriate SMBG and/or A1C goals.  discussed ADA recommendations for pregnancy for eye exam in first trimester; pt tested BG during visit-75 (2hr pp)   Acute complications Taught treatment of hypoglycemia - the 15 rule.  gave pt 1 tube of glucose tablets to carry if needed for low BG   Chronic complications Relationship between chronic complications and blood glucose control  discussed increased risk for  complications due to pregnancy and importance of tight BG control   Psychosocial adjustment Role of stress on diabetes   Preconception care Pregnancy and GDM  Role of pre-pregnancy blood glucose control on the development of the fetus;Reviewed with patient blood glucose goals with pregnancy      Individualized Plan for Diabetes Self-Management Training:   Learning Objective:  Patient will have a greater understanding of diabetes self-management. Patient education plan is to attend individual and/or group sessions per assessed needs and concerns.   Plan:   Patient Instructions  Read booklet on  Diabetes and Mothers To Be Follow  Meal Planning Guidelines-eat 3 meals/day and 3 snacks Include 1 carbohydrate serving/snack + protein Include 2 carbohydrate servings at breakfast and 3 carbohydrate servings at lunch and supper  Eat protein with all meals Space meals and snacks 2-3 hours apart Avoid sweetened beverages and fruit juices Limit intake of sweets/desserts and fried foods Make healthy food choices Complete a 3 Day Food Record and bring to next appointment Check blood sugars 4 x day - before breakfast and 2 hrs after every meal and record  Bring blood sugar log to all appointments Walk 20-30 minutes at least 5 x week if permitted by MD Make eye and dental appointments Next appointment:    05-02-15   Expected Outcomes:   positive  Education material provided: General Meal Planning Guidelines for Pregnancy, Diabetes Management for  Mothers To Be booklet, 1 tube of glucose tabs for PRN use if needed  If problems or questions, patient to contact team via:  [phone]  Future DSME appointment:  05-02-15  04-25-15 at 7pm-called pt who reports MD changed Novolog 70/30 insulin today to N 44 units + R 22 units before breakfast and N 20 units + R 16 units before supper

## 2015-04-25 NOTE — Patient Instructions (Signed)
Read booklet on  Diabetes and Mothers To Be Follow  Meal Planning Guidelines-eat 3 meals/day and 3 snacks Space meals and snacks 2-3 hours apart Avoid sweetened beverages and fruit juices Limit intake of sweets/desserts and fried foods Complete a 3 Day Food Record and bring to next appointment Check blood sugars 4 x day - before breakfast and 2 hrs after every meal and record  Bring blood sugar log to all appointments Walk 20-30 minutes at least 5 x week if permitted by MD Next appointment:    05-02-15

## 2015-04-28 ENCOUNTER — Telehealth: Payer: Self-pay | Admitting: Obstetrics and Gynecology

## 2015-04-28 NOTE — Progress Notes (Signed)
Hx. Reviewed.  I agree with [employer]'s note as outlined.  

## 2015-04-28 NOTE — Telephone Encounter (Signed)
We have been unable to reach Megan Stanton by phone to discuss the following normal results.  The recent InformaSeq testing (performed at [facility]) yielded NEGATIVE results in this twin gestation.  The patient's specimen showed DNA consistent with two copies of chromosomes 21, 18 and 13.  The sensitivity for trisomy 921, trisomy 6118 and trisomy 4713 using this testing are reported as 99.1%, 98.3% and 98.1% respectively.  Thus, while the results of this testing are highly accurate, they are not considered diagnostic at this time.  Should more definitive information be desired, the patient may still consider amniocentesis.   As requested by the patient, sex chromosome analysis was included for this sample.  Please contact our office or see the laboratory report for details if the patient desires this information. Note that reporting of gender based upon cell free fetal DNA testing in twins is complex. If only X chromosome material is present, then both babies are expected to be female.  If the testing shows the presence of Y chromosome material, then at least one baby is female.  We cannot determine if one or both babies is female by this test.   We may be reached at [phone] on Mondays and Thursdays.  A maternal serum AFP only should be considered if screening for neural tube defects is desired.  Cherly Andersoneborah F. Taziyah Iannuzzi, MS, CGC

## 2015-05-02 ENCOUNTER — Ambulatory Visit: Payer: Medicaid Other | Admitting: Dietician

## 2015-05-03 DIAGNOSIS — Z794 Long term (current) use of insulin: Principal | ICD-10-CM

## 2015-05-03 DIAGNOSIS — E119 Type 2 diabetes mellitus without complications: Secondary | ICD-10-CM

## 2015-05-12 ENCOUNTER — Encounter: Payer: Self-pay | Admitting: Dietician

## 2015-05-12 NOTE — Progress Notes (Signed)
Have not heard from patient to reschedule missed appointment. Sent discharge letter to MD. 

## 2015-05-12 NOTE — Progress Notes (Signed)
Marked as not completed.

## 2015-05-19 ENCOUNTER — Other Ambulatory Visit: Payer: Self-pay

## 2015-05-19 ENCOUNTER — Telehealth: Payer: Self-pay | Admitting: Obstetrics and Gynecology

## 2015-05-19 DIAGNOSIS — O30002 Twin pregnancy, unspecified number of placenta and unspecified number of amniotic sacs, second trimester: Secondary | ICD-10-CM

## 2015-05-19 NOTE — Telephone Encounter (Signed)
I spoke with Megan Stanton by phone today, as she was referred for additional genetic counseling and a detailed anatomy ultrasound due to abnormal maternal serum AFP results.  The results showed an increased chance for an open neural tube defect of 1 in 108 in this twin gestation. Increased levels of AFP may occur for many reasons including: inaccurate pregnancy dating, pregnancy complications, neural tube defects, abdominal wall defects or a normal pregnancy. We discussed the use of ultrasound to evaluate fetal anatomy to assess for evidence of a neural tube defect. We also talked about the option of amniocentesis; however, if the ultrasound is normal, the likelihood of a complication from the amniocentesis may be higher than the chance for a neural tube defect in either fetus.  The patient was scheduled for a detailed anatomy [facility] Thursday, April 13 [facility]  Results of the ultrasound will be discussed with the patient at that time.  No genetic counselor will be present in clinic that day, but she will speak with the MFM and we are happy to schedule additional genetic counseling the following clinic day if desired.    Cherly Andersoneborah F. Ailah Barna, MS, CGC

## 2015-05-25 ENCOUNTER — Encounter: Payer: Self-pay | Admitting: Nurse Practitioner

## 2015-05-26 ENCOUNTER — Ambulatory Visit: Payer: Medicaid Other

## 2015-05-26 ENCOUNTER — Ambulatory Visit
Admission: RE | Admit: 2015-05-26 | Discharge: 2015-05-26 | Disposition: A | Discharge status: Home / Self Care | Payer: Medicaid Other | Source: Ambulatory Visit | Attending: Maternal & Fetal Medicine | Admitting: Maternal & Fetal Medicine

## 2015-05-26 DIAGNOSIS — O24912 Unspecified diabetes mellitus in pregnancy, second trimester: Secondary | ICD-10-CM | POA: Diagnosis present

## 2015-05-26 DIAGNOSIS — O30002 Twin pregnancy, unspecified number of placenta and unspecified number of amniotic sacs, second trimester: Secondary | ICD-10-CM

## 2015-05-26 DIAGNOSIS — Z3A18 18 weeks gestation of pregnancy: Secondary | ICD-10-CM | POA: Diagnosis not present

## 2015-05-26 DIAGNOSIS — O30042 Twin pregnancy, dichorionic/diamniotic, second trimester: Secondary | ICD-10-CM | POA: Insufficient documentation

## 2015-05-26 DIAGNOSIS — O2622 Pregnancy care for patient with recurrent pregnancy loss, second trimester: Secondary | ICD-10-CM | POA: Diagnosis not present

## 2015-05-31 ENCOUNTER — Ambulatory Visit: Payer: Self-pay

## 2015-06-06 ENCOUNTER — Ambulatory Visit: Payer: Medicaid Other | Admitting: Dietician

## 2015-06-17 ENCOUNTER — Encounter: Payer: Self-pay | Admitting: Dietician

## 2015-06-17 NOTE — Progress Notes (Signed)
Patient did not keep appointment on 06/06/15, and has not returned phone message to reschedule.  Sent second discharge letter to MD.

## 2015-06-20 ENCOUNTER — Other Ambulatory Visit: Payer: Self-pay

## 2015-06-20 ENCOUNTER — Ambulatory Visit
Admission: RE | Admit: 2015-06-20 | Discharge: 2015-06-20 | Disposition: A | Discharge status: Home / Self Care | Payer: Medicaid Other | Source: Ambulatory Visit | Attending: Obstetrics & Gynecology | Admitting: Obstetrics & Gynecology

## 2015-06-20 VITALS — BP 132/83 | HR 109 | Temp 98.6°F | Resp 18 | Ht 69.0 in | Wt 281.0 lb

## 2015-06-20 DIAGNOSIS — IMO0001 Reserved for inherently not codable concepts without codable children: Secondary | ICD-10-CM

## 2015-06-20 DIAGNOSIS — O09292 Supervision of pregnancy with other poor reproductive or obstetric history, second trimester: Secondary | ICD-10-CM

## 2015-06-20 DIAGNOSIS — O30042 Twin pregnancy, dichorionic/diamniotic, second trimester: Secondary | ICD-10-CM

## 2015-06-20 DIAGNOSIS — O09899 Supervision of other high risk pregnancies, unspecified trimester: Secondary | ICD-10-CM | POA: Insufficient documentation

## 2015-06-20 DIAGNOSIS — O30041 Twin pregnancy, dichorionic/diamniotic, first trimester: Secondary | ICD-10-CM

## 2015-06-20 DIAGNOSIS — O09521 Supervision of elderly multigravida, first trimester: Secondary | ICD-10-CM

## 2015-06-20 DIAGNOSIS — O09299 Supervision of pregnancy with other poor reproductive or obstetric history, unspecified trimester: Secondary | ICD-10-CM | POA: Insufficient documentation

## 2015-07-05 ENCOUNTER — Encounter: Payer: Medicaid Other | Attending: Obstetrics and Gynecology | Admitting: Dietician

## 2015-07-05 VITALS — BP 104/62 | Wt 283.0 lb

## 2015-07-05 DIAGNOSIS — O24112 Pre-existing diabetes mellitus, type 2, in pregnancy, second trimester: Secondary | ICD-10-CM

## 2015-07-05 DIAGNOSIS — E119 Type 2 diabetes mellitus without complications: Secondary | ICD-10-CM | POA: Insufficient documentation

## 2015-07-05 NOTE — Progress Notes (Signed)
Diabetes Self-Management Education  Visit Type:  Follow-up  Appt. Start Time: 0900 Appt. End Time: 1030  07/05/2015  [name], identified by name and date of birth, is a 35 y.o. female with a diagnosis of Diabetes:  .   ASSESSMENT-pt reports now working regular hours 5p-11p 4x/wk.   Blood pressure 104/62, weight 283 lb (128.368 kg), last menstrual period 01/20/2015, unknown if currently breastfeeding. Body mass index is 41.77 kg/(m^2). Lacks knowledge of diabetes care and healthy diet Pt takes insulin shots after eating breakfast and supper Pt takes N and R as separate injections AM and PM Takes insulin shots on left lower abdominal area only Does not carry medical alert ID Does not carry low BG treatment Meal times vary  Eats varying amounts of carbohydrates at meals and snacks  Eats high fat foods and some concentrated sweets      Diabetes Self-Management Education - 07/05/15 1047    Complications   Last HgB A1C per patient/outside source 6.6 %  06-2015   How often do you check your blood sugar? 3-4 times/day   Fasting Blood glucose range (mg/dL) 16-109;604-54070-129;130-179  [phone]   Postprandial Blood glucose range (mg/dL) <14;78-295;621-308;>657<70;70-129;130-179;>200  48-255 pp breakfast; 80-218 pp lunch; 68-154 pp supper   Number of hypoglycemic episodes per month 2  48 pp breakfast; 34 at bedtime   Can you tell when your blood sugar is low? Yes   Have you had a dilated eye exam in the past 12 months? No   Have you had a dental exam in the past 12 months? No   Are you checking your feet? Yes   How many days per week are you checking your feet? 7   Dietary Intake   Breakfast eats 3 meals/day  eats sweets at times &using regular syrup on french toast   Snack (morning) eats 1-3 snacks/day  occasionally skips AM and bedtime snacks   Lunch --  eats high fat meals frequently and some high carb meals   Beverage(s) drinks 1/2 and 1/2 sweet tea   Exercise   Exercise Type Light (walking / raking  leaves)   How many days per week to you exercise? 2   How many minutes per day do you exercise? 20   Total minutes per week of exercise 40   Outcomes   Program Status Re-entered      Learning Objective:  Patient will have a greater understanding of diabetes self-management. Patient education plan is to attend individual and/or group sessions per assessed needs and concerns.   Plan:   Patient Instructions   Follow Meal Planning Guidelines for Healthy Pregnancy-eat 3 meals and 3 snacks/day Eat 2 carbohydrate servings at breakfast + protein and 3 carbohydrate servings + protein at lunch and supper  Eat 1 carbohydrate serving/snack + protein Avoid sweetened beverages Limit intake of fried foods and sweets Make healthy food choices Complete a 3 Day Food Record and bring to next appointment Check blood sugars 4 x day - before breakfast and 2 hrs after every meal and record  Bring blood sugar log to all appointments Walk 20-30 minutes at least 5 x week if permitted by MD Rotate sites for insulin injections May mix PM dose of N and R (follow instructions given)-continue to take N and R as separate shots in AM Take insulin shots 30 min. before eating breakfast and supper Get a needle holder for used syringes and lancets Use sugar free syrup instead of regular syrup Carry glucose tablets or  candy at all times Next appointment    07-21-15    Expected Outcomes:     Education material provided: General Meal Guidelines for Healthy Pregnancy, medical alert ID card, 1 tube of glucose tablets to carry at all times for PRN use Reviewed use of N and R insulin and instructed on how to mix N & R-advised pt to take insulin injections 30 min. before eating breakfast and supper when able (can mix PM doses of N & R but continue to take AM doses of N & R in separate shots)  Reviewed low BG treatment; discussed importance of tight BG control to lower the risk of complications for her and baby;discussed  that  BG's will likely increase as pregnancy progresses and will  require adjustment of insulin doses to control BG's; If problems or questions, patient to contact team via:  [phone]  Future DSME appointment: -  07-21-15

## 2015-07-05 NOTE — Patient Instructions (Signed)
  Follow Meal Planning Guidelines for Healthy Pregnancy-eat 3 meals and 3 snacks/day Eat 2 carbohydrate servings at breakfast + protein and 3 carbohydrate servings + protein at lunch and supper  Eat 1 carbohydrate serving/snack + protein Avoid sweetened beverages Limit intake of fried foods and sweets Make healthy food choices Complete a 3 Day Food Record and bring to next appointment Check blood sugars 4 x day - before breakfast and 2 hrs after every meal and record  Bring blood sugar log to all appointments Walk 20-30 minutes at least 5 x week if permitted by MD Rotate sites for insulin injections May mix PM dose of N and R (follow instructions given)-continue to take N and R as separate shots in AM Take insulin shots 30 min. before eating breakfast and supper Get a needle holder for old syringes Use sugar free syrup instead of regular syrup Carry glucose tablets or candy at all times Next appointment    07-21-15

## 2015-07-14 ENCOUNTER — Other Ambulatory Visit: Payer: Self-pay

## 2015-07-14 DIAGNOSIS — O30042 Twin pregnancy, dichorionic/diamniotic, second trimester: Secondary | ICD-10-CM

## 2015-07-18 ENCOUNTER — Ambulatory Visit: Payer: Medicaid Other

## 2015-07-21 ENCOUNTER — Encounter: Payer: Self-pay | Admitting: Dietician

## 2015-07-21 ENCOUNTER — Encounter: Payer: Medicaid Other | Attending: Obstetrics and Gynecology | Admitting: Dietician

## 2015-07-21 VITALS — BP 106/60 | Ht 69.0 in | Wt 283.8 lb

## 2015-07-21 DIAGNOSIS — E119 Type 2 diabetes mellitus without complications: Secondary | ICD-10-CM | POA: Diagnosis not present

## 2015-07-21 DIAGNOSIS — O24112 Pre-existing diabetes mellitus, type 2, in pregnancy, second trimester: Secondary | ICD-10-CM

## 2015-07-21 NOTE — Patient Instructions (Signed)
   OK to try Glucerna drink if you are not hungry for lunch. Add a fruit and/or nuts or cheese for additional calories.   Include protein with every meal and snack.   Continue to avoid drinks with sugar. Choose water, Crystal Light, or tea with Splenda. You can occasionally have Healthy Balance juice drinks (very low sugar).

## 2015-07-21 NOTE — Progress Notes (Signed)
Diabetes Self-Management Education  Visit Type:  Follow-up  Appt. Start Time: 1315 Appt. End Time: 1420  07/21/2015  [name], identified by name and date of birth, is a 35 y.o. female with a diagnosis of Diabetes:  .   ASSESSMENT  Blood pressure 106/60, height  (1.753 m), weight 283 lb 12.8 oz (128.731 kg), last menstrual period 01/20/2015, unknown if currently breastfeeding. Body mass index is 41.89 kg/(m^2).       Diabetes Self-Management Education - 07/21/15 1500    Complications   Last HgB A1C per patient/outside source 6.3 %   How often do you check your blood sugar? 3-4 times/day   Fasting Blood glucose range (mg/dL) 16-109  [id number]$ /dl   Postprandial Blood glucose range (mg/dL) <73;22-025;427-062   Have you had a dilated eye exam in the past 12 months? No   Have you had a dental exam in the past 12 months? No   Are you checking your feet? Yes   How many days per week are you checking your feet? 7   Dietary Intake   Breakfast 2-3 meals and 1-3 snacks daily   Beverage(s) drinks water, teat with Splenda, occasional regular Ginger ale   Exercise   Exercise Type Light (walking / raking leaves)   How many days per week to you exercise? 2   How many minutes per day do you exercise? 20   Total minutes per week of exercise 40   Patient Education   Nutrition management  Role of diet in the treatment of diabetes and the relationship between the three main macronutrients and blood glucose level;Food label reading, portion sizes and measuring food.;Reviewed blood glucose goals for pre and post meals and how to evaluate the patients' food intake on their blood glucose level.;Meal options for control of blood glucose level and chronic complications.  2000kcal meal plan + menus; food safety   Physical activity and exercise  Role of exercise on diabetes management, blood pressure control and cardiac health.   Post-Education Assessment   Patient understands the diabetes  disease and treatment process. Demonstrates understanding / competency   Patient understands incorporating nutritional management into lifestyle. Demonstrates understanding / competency   Patient undertands incorporating physical activity into lifestyle. Demonstrates understanding / competency   Patient understands using medications safely. Demonstrates understanding / competency   Patient understands monitoring blood glucose, interpreting and using results Demonstrates understanding / competency   Patient understands prevention, detection, and treatment of acute complications. Demonstrates understanding / competency   Patient understands prevention, detection, and treatment of chronic complications. Demonstrates understanding / competency   Patient understands how to develop strategies to address psychosocial issues. Needs Review   Patient understands how to develop strategies to promote health/change behavior. Demonstrates understanding / competency   Outcomes   Program Status Completed      Learning Objective:  Patient will have a greater understanding of diabetes self-management. Patient education plan is to attend individual and/or group sessions per assessed needs and concerns.   Plan:   Patient Instructions   OK to try Glucerna drink if you are not hungry for lunch. Add a fruit and/or nuts or cheese for additional calories.   Include protein with every meal and snack.   Continue to avoid drinks with sugar. Choose water, Crystal Light, or tea with Splenda. You can occasionally have Healthy Balance juice drinks (very low sugar).      Expected Outcomes:  Demonstrated interest in learning. Expect positive outcomes (partner also in attendance  at visit and provides support)  Education material provided: 2000kcal meal plan with menus; Planning A Balanced Meal; Food Safety  If problems or questions, patient to contact team via:  Phone  Future DSME appointment: -  patient to call  if needed.

## 2015-07-28 ENCOUNTER — Ambulatory Visit
Admission: RE | Admit: 2015-07-28 | Discharge: 2015-07-28 | Disposition: A | Discharge status: Home / Self Care | Payer: Medicaid Other | Source: Ambulatory Visit | Attending: Obstetrics and Gynecology | Admitting: Obstetrics and Gynecology

## 2015-07-28 ENCOUNTER — Other Ambulatory Visit: Payer: Self-pay

## 2015-07-28 VITALS — BP 130/80 | HR 108 | Temp 98.1°F | Resp 18 | Wt 284.6 lb

## 2015-07-28 DIAGNOSIS — Z36 Encounter for antenatal screening of mother: Secondary | ICD-10-CM | POA: Diagnosis present

## 2015-07-28 DIAGNOSIS — O30041 Twin pregnancy, dichorionic/diamniotic, first trimester: Secondary | ICD-10-CM

## 2015-07-28 DIAGNOSIS — IMO0001 Reserved for inherently not codable concepts without codable children: Secondary | ICD-10-CM

## 2015-07-28 DIAGNOSIS — O09521 Supervision of elderly multigravida, first trimester: Secondary | ICD-10-CM

## 2015-07-28 DIAGNOSIS — Z3A27 27 weeks gestation of pregnancy: Secondary | ICD-10-CM | POA: Insufficient documentation

## 2015-07-28 DIAGNOSIS — O09292 Supervision of pregnancy with other poor reproductive or obstetric history, second trimester: Secondary | ICD-10-CM

## 2015-07-28 DIAGNOSIS — O30042 Twin pregnancy, dichorionic/diamniotic, second trimester: Secondary | ICD-10-CM

## 2015-08-04 ENCOUNTER — Ambulatory Visit: Payer: Self-pay

## 2015-08-10 ENCOUNTER — Encounter: Payer: Self-pay | Admitting: *Deleted

## 2015-08-10 ENCOUNTER — Inpatient Hospital Stay
Admission: RE | Admit: 2015-08-10 | Discharge: 2015-08-10 | Disposition: A | Discharge status: Home / Self Care | Payer: Medicaid Other | Attending: Obstetrics and Gynecology | Admitting: Obstetrics and Gynecology

## 2015-08-10 DIAGNOSIS — IMO0001 Reserved for inherently not codable concepts without codable children: Secondary | ICD-10-CM

## 2015-08-10 DIAGNOSIS — O09292 Supervision of pregnancy with other poor reproductive or obstetric history, second trimester: Secondary | ICD-10-CM

## 2015-08-10 DIAGNOSIS — E119 Type 2 diabetes mellitus without complications: Secondary | ICD-10-CM | POA: Diagnosis not present

## 2015-08-10 DIAGNOSIS — O99213 Obesity complicating pregnancy, third trimester: Secondary | ICD-10-CM | POA: Diagnosis not present

## 2015-08-10 DIAGNOSIS — O30043 Twin pregnancy, dichorionic/diamniotic, third trimester: Secondary | ICD-10-CM | POA: Diagnosis present

## 2015-08-10 DIAGNOSIS — O09521 Supervision of elderly multigravida, first trimester: Secondary | ICD-10-CM

## 2015-08-10 DIAGNOSIS — Z3A28 28 weeks gestation of pregnancy: Secondary | ICD-10-CM | POA: Diagnosis not present

## 2015-08-10 DIAGNOSIS — O24313 Unspecified pre-existing diabetes mellitus in pregnancy, third trimester: Secondary | ICD-10-CM | POA: Insufficient documentation

## 2015-08-10 DIAGNOSIS — O30041 Twin pregnancy, dichorionic/diamniotic, first trimester: Secondary | ICD-10-CM

## 2015-08-10 LAB — GLUCOSE, CAPILLARY: GLUCOSE-CAPILLARY: 184 mg/dL — AB (ref 65–99)

## 2015-08-10 LAB — OB RESULTS CONSOLE HIV ANTIBODY (ROUTINE TESTING): HIV: NONREACTIVE

## 2015-08-10 NOTE — OB Triage Provider Note (Signed)
   Triage visit for NST   Megan Stanton is a 35 y.o. [id number]. She is at 2713w6d gestation. She presents for a scheduled NST. She has a didi twin IUP  Indication: Prior IUFD, prior multiple SAB.  S: Resting comfortably. no CTX, no VB. Active fetal movement. O:  LMP 01/20/2015 Results for orders placed or performed during the hospital encounter of 08/10/15 (from the past 48 hour(s))  Glucose, capillary   Collection Time: 08/10/15 12:21 PM  Result Value Ref Range   Glucose-Capillary 184 (H) 65 - 99 mg/dL     Gen: NAD, AAOx3      Abd: FNTTP      Ext: Non-tender, Nonedmeatous    FHT:  Baby A: 145, mod var, 10x10 accels, no decels Baby B: 145- 150, mod var, 10x10 accels, no decels TOCO: quiet SVE:  deferred   A/P:  35 y.o. A5W0981G4P1020 2313w6d with hx of IUFD, multiple SAB, obesity, current didi TIUP,insulin dependent DM, single umbilical artery, abnormal AFP screen with normal anatomy ultrasounds.   Reactive NST appropriate for gestational ages, with moderate variability and accelerations, no decels  Fetal Wellbeing x2: Reassuring  She will have AFI/MVP today to complete modified BPP from prior IUFD in the office.  We are planning weekly fetal surveillance (modified BPP) until delivery  D/c home stable, precautions reviewed, follow-up as scheduled.

## 2015-08-10 NOTE — Discharge Instructions (Signed)
Drink Plenty of fluids and get plenty of rest. [facility] at 11:00 am for a repeat non-stress test.

## 2015-08-10 NOTE — OB Triage Note (Addendum)
34yo Megan Stanton G4P0 presents for scheduled non-stress test for Twin Gestation. Denies leaking of fluid or bleeding. Feels babies moving.

## 2015-08-10 NOTE — Discharge Summary (Signed)
Pt d/c'd to home in wheelchair. Stable condition upon departure. Pt given d/c instructions on what to watch for preterm labor, verbalized understanding. Appointment for subsequent NSTs made. No other concerns.

## 2015-08-17 ENCOUNTER — Inpatient Hospital Stay
Admission: RE | Admit: 2015-08-17 | Discharge: 2015-08-17 | Disposition: A | Discharge status: Home / Self Care | Payer: Medicaid Other | Attending: Obstetrics and Gynecology | Admitting: Obstetrics and Gynecology

## 2015-08-17 DIAGNOSIS — Z3493 Encounter for supervision of normal pregnancy, unspecified, third trimester: Secondary | ICD-10-CM | POA: Diagnosis present

## 2015-08-17 DIAGNOSIS — O09292 Supervision of pregnancy with other poor reproductive or obstetric history, second trimester: Secondary | ICD-10-CM

## 2015-08-17 DIAGNOSIS — Z3A30 30 weeks gestation of pregnancy: Secondary | ICD-10-CM | POA: Diagnosis not present

## 2015-08-17 DIAGNOSIS — IMO0001 Reserved for inherently not codable concepts without codable children: Secondary | ICD-10-CM

## 2015-08-17 DIAGNOSIS — O09521 Supervision of elderly multigravida, first trimester: Secondary | ICD-10-CM

## 2015-08-17 DIAGNOSIS — O30041 Twin pregnancy, dichorionic/diamniotic, first trimester: Secondary | ICD-10-CM

## 2015-08-17 NOTE — Discharge Summary (Signed)
Discharge instructions reviewed with patient including follow up appointments and when to seek medical attention. All questions answered. Patient discharged in stable condition via wheelchair, escorted by significant other.

## 2015-08-17 NOTE — Progress Notes (Signed)
[facility]ed [facility]: Pt there for NST; Baby A: NST reactive with 2 accels 15 x 15 BPM Baby: B: NST reactive with 2 accels 15  15 BPM Reviewed DiDi Twins strips with Marye RoundJenna Suggs, RN over the phone. AFI weekly and NST weekly.

## 2015-08-24 ENCOUNTER — Encounter: Payer: Self-pay | Admitting: *Deleted

## 2015-08-24 ENCOUNTER — Observation Stay
Admission: RE | Admit: 2015-08-24 | Discharge: 2015-08-24 | Disposition: A | Discharge status: Home / Self Care | Payer: Medicaid Other | Attending: Obstetrics and Gynecology | Admitting: Obstetrics and Gynecology

## 2015-08-24 DIAGNOSIS — Z3A30 30 weeks gestation of pregnancy: Secondary | ICD-10-CM | POA: Diagnosis not present

## 2015-08-24 DIAGNOSIS — O30003 Twin pregnancy, unspecified number of placenta and unspecified number of amniotic sacs, third trimester: Secondary | ICD-10-CM | POA: Diagnosis not present

## 2015-08-24 DIAGNOSIS — O09521 Supervision of elderly multigravida, first trimester: Secondary | ICD-10-CM

## 2015-08-24 DIAGNOSIS — O30041 Twin pregnancy, dichorionic/diamniotic, first trimester: Secondary | ICD-10-CM

## 2015-08-24 DIAGNOSIS — O09292 Supervision of pregnancy with other poor reproductive or obstetric history, second trimester: Secondary | ICD-10-CM

## 2015-08-24 DIAGNOSIS — O30009 Twin pregnancy, unspecified number of placenta and unspecified number of amniotic sacs, unspecified trimester: Secondary | ICD-10-CM | POA: Diagnosis present

## 2015-08-24 DIAGNOSIS — IMO0001 Reserved for inherently not codable concepts without codable children: Secondary | ICD-10-CM

## 2015-08-24 LAB — URINALYSIS COMPLETE WITH MICROSCOPIC (ARMC ONLY)
Bilirubin Urine: NEGATIVE
Glucose, UA: 50 mg/dL — AB
HGB URINE DIPSTICK: NEGATIVE
NITRITE: NEGATIVE
PROTEIN: 30 mg/dL — AB
SPECIFIC GRAVITY, URINE: 1.024 (ref 1.005–1.030)
pH: 5 (ref 5.0–8.0)

## 2015-08-24 LAB — GLUCOSE, CAPILLARY: Glucose-Capillary: 184 mg/dL — ABNORMAL HIGH (ref 65–99)

## 2015-08-24 MED ORDER — BETAMETHASONE SOD PHOS & ACET 6 (3-3) MG/ML IJ SUSP
12.0000 mg | Freq: Once | INTRAMUSCULAR | Status: AC
  Administered 2015-08-24: 12 mg via INTRAMUSCULAR
  Filled 2015-08-24: qty 1

## 2015-08-24 MED ORDER — INSULIN ASPART 100 UNIT/ML ~~LOC~~ SOLN
6.0000 [IU] | Freq: Once | SUBCUTANEOUS | Status: AC
  Administered 2015-08-24: 6 [IU] via SUBCUTANEOUS
  Filled 2015-08-24: qty 0.06
  Filled 2015-08-24: qty 6

## 2015-08-24 NOTE — Discharge Instructions (Signed)
PRETERM LABOR: Includes any of the following symptoms that occur between 20-[redacted] weeks gestation. If these symptoms are not stopped, preterm labor can result in preterm delivery, placing your baby at risk. ° °Notify your doctor if any of the following occur: °1. Menstrual-like cramps   5. Pelvic pressure  °2. Uterine contractions. These may be painless and feel like the uterus is tightening or the baby is "balling up" 6. Increase or change in vaginal discharge  °3. Low, dull backache, unrelieved by heat or Tylenol  7. Vaginal bleeding  °4. Intestinal cramps, with our without diarrhea, sometimes 8. A general feeling that "something is not right"  ° 9. Leaking of fluid described as "gas pain"  ° °

## 2015-08-24 NOTE — Discharge Summary (Signed)
  Patient ID: Megan Stanton, female   DOB: 1980/08/31, 35 y.o.   MRN: [id number] Megan Stanton 1980/08/31 G5 P1120 2522w6d presents for cervical dilation noted in office .  noLOF , no vaginal bleeding , O;BP 109/91 mmHg  Pulse 107  Temp(Src) 98.1 F (36.7 C) (Oral)  Resp 18  Ht 5\' 9"  (1.753 m)  Wt 286 lb (129.729 kg)  BMI 42.22 kg/m2  LMP 01/20/2015 ABDgravid  Fundal ht 40 cm  CX 1cm / 80 /-3 vtx ( checked by TJS at 1600 and at 2000-no change)  NSTbaby A 140 + accels , reactive , no decels ; baby B 150-160 + accels , reactive no decels . No CTX noted on prolonged monitoring  Labs: glucose 184, ua + ketones  A: 30+6 twin gestation with early passive cx dilation and effacement. No evidence of CTX and no cervical change after 3 hrs observation . Reassuring NST A+B  P:Betamethasone 12.5 mg IM shot tonight and repeat in 24 hours in the event of continued dilation and PTD. She is aware that glucose values may be increased and she is aware how to adjust with her sliding scale .  Work status addressed . 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 OK for now  Pelvic rest  MFM eval growth u/s and AFI tomorrow . Second betamethasone shot tomorrow  Clinic visit with me on Monday for repeat cervix check . Precautions given otherwise .

## 2015-08-25 ENCOUNTER — Ambulatory Visit
Admission: RE | Admit: 2015-08-25 | Discharge: 2015-08-25 | Disposition: A | Discharge status: Home / Self Care | Payer: Medicaid Other | Source: Ambulatory Visit | Attending: Maternal & Fetal Medicine | Admitting: Maternal & Fetal Medicine

## 2015-08-25 ENCOUNTER — Other Ambulatory Visit: Payer: Self-pay | Admitting: Obstetrics and Gynecology

## 2015-08-25 ENCOUNTER — Inpatient Hospital Stay
Admission: RE | Admit: 2015-08-25 | Discharge: 2015-08-25 | Disposition: A | Discharge status: Home / Self Care | Payer: Medicaid Other | Attending: Obstetrics and Gynecology | Admitting: Obstetrics and Gynecology

## 2015-08-25 VITALS — BP 125/71 | HR 102 | Temp 98.5°F | Resp 18 | Wt 283.8 lb

## 2015-08-25 DIAGNOSIS — Z3A30 30 weeks gestation of pregnancy: Secondary | ICD-10-CM | POA: Diagnosis not present

## 2015-08-25 DIAGNOSIS — O3433 Maternal care for cervical incompetence, third trimester: Secondary | ICD-10-CM | POA: Insufficient documentation

## 2015-08-25 DIAGNOSIS — O30041 Twin pregnancy, dichorionic/diamniotic, first trimester: Secondary | ICD-10-CM

## 2015-08-25 DIAGNOSIS — Z3A31 31 weeks gestation of pregnancy: Secondary | ICD-10-CM | POA: Diagnosis not present

## 2015-08-25 DIAGNOSIS — O09521 Supervision of elderly multigravida, first trimester: Secondary | ICD-10-CM

## 2015-08-25 DIAGNOSIS — O30043 Twin pregnancy, dichorionic/diamniotic, third trimester: Secondary | ICD-10-CM | POA: Diagnosis not present

## 2015-08-25 MED ORDER — BETAMETHASONE SOD PHOS & ACET 6 (3-3) MG/ML IJ SUSP
12.0000 mg | Freq: Once | INTRAMUSCULAR | Status: AC
  Administered 2015-08-25: 12 mg via INTRAMUSCULAR

## 2015-08-31 ENCOUNTER — Observation Stay
Admission: RE | Admit: 2015-08-31 | Discharge: 2015-08-31 | Disposition: A | Discharge status: Home / Self Care | Payer: Medicaid Other | Attending: Obstetrics and Gynecology | Admitting: Obstetrics and Gynecology

## 2015-08-31 DIAGNOSIS — Z3A31 31 weeks gestation of pregnancy: Secondary | ICD-10-CM | POA: Insufficient documentation

## 2015-08-31 DIAGNOSIS — Z794 Long term (current) use of insulin: Secondary | ICD-10-CM | POA: Diagnosis not present

## 2015-08-31 DIAGNOSIS — O30043 Twin pregnancy, dichorionic/diamniotic, third trimester: Principal | ICD-10-CM | POA: Insufficient documentation

## 2015-08-31 DIAGNOSIS — E119 Type 2 diabetes mellitus without complications: Secondary | ICD-10-CM | POA: Insufficient documentation

## 2015-08-31 DIAGNOSIS — O99213 Obesity complicating pregnancy, third trimester: Secondary | ICD-10-CM | POA: Diagnosis not present

## 2015-08-31 DIAGNOSIS — E669 Obesity, unspecified: Secondary | ICD-10-CM | POA: Diagnosis not present

## 2015-08-31 DIAGNOSIS — O24113 Pre-existing diabetes mellitus, type 2, in pregnancy, third trimester: Secondary | ICD-10-CM | POA: Diagnosis not present

## 2015-08-31 DIAGNOSIS — IMO0001 Reserved for inherently not codable concepts without codable children: Secondary | ICD-10-CM

## 2015-08-31 DIAGNOSIS — O09521 Supervision of elderly multigravida, first trimester: Secondary | ICD-10-CM

## 2015-08-31 DIAGNOSIS — O30049 Twin pregnancy, dichorionic/diamniotic, unspecified trimester: Secondary | ICD-10-CM | POA: Diagnosis present

## 2015-08-31 DIAGNOSIS — O30041 Twin pregnancy, dichorionic/diamniotic, first trimester: Secondary | ICD-10-CM

## 2015-08-31 NOTE — Discharge Summary (Signed)
  Triage visit for NST   Alinda Doomseresa W Schweigert is a 35 y.o. [id number]. She is at 1576w6d gestation. She presents for a scheduled NST.  Indication: Mercie Eoni Di Twins, insulin dependent Type 2 DM, Obesity, Prior IUFD  S: Resting comfortably. no CTX, no VB. Active fetal movement.  O:  LMP 01/20/2015 No results found for this or any previous visit (from the past 48 hour(s)).   Gen: NAD, AAOx3  Abd: FNTTP Ext: Non-tender, Nonedmeatous  FHT:  Twin A: Baseline: 140-145bpm / Moderate variability/ +10x10 accels/no decels  Twin B: Baseline: 155bpm/ Moderate variability/ +10x10 accels/ no decels  TOCO: quiet SVE:  deferred   A/P: 35 y.o. [id number] 4076w6d with Mercie Eoni Di Twins, insulin dependent Type 2 DM, Obesity, S/P BMZ x2 last week    Reactive NST, appropriate for gestational ages, with moderate variability and accelerations, no decels  Fetal Wellbeing x2: Reassuring  Twice weekly NSTs and weekly AFI  D/c home stable, precautions reviewed, follow-up tomorrow with scheduled appt on 7/20  Dr. Feliberto GottronSchermerhorn notified and aware  Carlean JewsMeredith Dayna Alia, CNM

## 2015-08-31 NOTE — Progress Notes (Signed)
  Triage visit for NST   Megan Stanton is a 34 y.o. G4P1020. She is at [redacted]w[redacted]d gestation. She presents for a scheduled NST.  Indication: Di Di Twins, insulin dependent Type 2 DM, Obesity, Prior IUFD  S: Resting comfortably. no CTX, no VB. Active fetal movement.  O:  LMP 01/20/2015 No results found for this or any previous visit (from the past 48 hour(s)).   Gen: NAD, AAOx3  Abd: FNTTP Ext: Non-tender, Nonedmeatous  FHT:  Twin A: Baseline: 140-145bpm / Moderate variability/ +10x10 accels/no decels  Twin B: Baseline: 155bpm/ Moderate variability/ +10x10 accels/ no decels  TOCO: quiet SVE:  deferred   A/P: 34 y.o. G4P1020 [redacted]w[redacted]d with Di Di Twins, insulin dependent Type 2 DM, Obesity, S/P BMZ x2 last week    Reactive NST, appropriate for gestational ages, with moderate variability and accelerations, no decels  Fetal Wellbeing x2: Reassuring  Twice weekly NSTs and weekly AFI  D/c home stable, precautions reviewed, follow-up tomorrow with scheduled appt on 7/20  Dr. Schermerhorn notified and aware  Clemie General, CNM  

## 2015-09-02 ENCOUNTER — Inpatient Hospital Stay
Admission: RE | Admit: 2015-09-02 | Discharge: 2015-09-02 | Disposition: A | Discharge status: Home / Self Care | Payer: Medicaid Other | Attending: Obstetrics and Gynecology | Admitting: Obstetrics and Gynecology

## 2015-09-02 NOTE — OB Triage Note (Signed)
[facility] for scheduled [facility]ST for twins. EDC 10/27/2015  32.1 weeks. States active fetal movement of both boys and no pain. Occas uc's noted. Discussed plan of care. Snack given to pt. Ellison Carwin Cobie Marcoux RNC

## 2015-09-02 NOTE — Progress Notes (Signed)
   Triage visit for NST   Alinda Doomseresa W Hazelip is a 35 y.o. [id number]. She is at 3970w1d gestation. She presents for a scheduled NST.  Indication: Mercie Eoni Di Twins, insulin dependent Type 2 DM, Obesity, Prior IUFD  S: Resting comfortably. no CTX, no VB. Active fetal movement. O:  LMP 01/20/2015 No results found for this or any previous visit (from the past 48 hour(s)).   Gen: NAD, AAOx3      Abd: FNTTP      Ext: Non-tender, Nonedmeatous    FHT:  Twin A: Baseline: 135 bpm/ moderate variability/+accels/no decels Twin B: Baseline: 150 bpm/ moderate variability/+accels/no decels  TOCO: x1 UC SVE:  deferred   A/P:  35 y.o. [id number] 4070w1d with Mercie Eoni Di Twins, insulin dependent Type 2 DM, Obesity, Prior IUFD    Reactive NST, with moderate variability and accelerations, no decels  Fetal Wellbeing: Reassuring  D/c home stable, precautions reviewed, follow-up as scheduled.   Dr. Dalbert GarnetBeasley aware and updated and agrees with plan  Carlean JewsMeredith Jachin Coury, CNM

## 2015-09-02 NOTE — Plan of Care (Signed)
Discharge instructions, both oral and written, given to pt and husband. [facility] but cannot remember to tell me. She is aware of kick counts and keeping up with fetal movements for the two boys. Pt leaving department in stable condition ambulatory with husband at side. Ellison Carwin Nayah Lukens RNC

## 2015-09-05 ENCOUNTER — Inpatient Hospital Stay
Admission: RE | Admit: 2015-09-05 | Discharge: 2015-09-05 | Disposition: A | Discharge status: Home / Self Care | Payer: Medicaid Other | Attending: Obstetrics and Gynecology | Admitting: Obstetrics and Gynecology

## 2015-09-05 DIAGNOSIS — O30043 Twin pregnancy, dichorionic/diamniotic, third trimester: Secondary | ICD-10-CM | POA: Insufficient documentation

## 2015-09-05 DIAGNOSIS — O99213 Obesity complicating pregnancy, third trimester: Secondary | ICD-10-CM | POA: Insufficient documentation

## 2015-09-05 DIAGNOSIS — O24113 Pre-existing diabetes mellitus, type 2, in pregnancy, third trimester: Secondary | ICD-10-CM | POA: Diagnosis not present

## 2015-09-05 DIAGNOSIS — Z794 Long term (current) use of insulin: Secondary | ICD-10-CM | POA: Diagnosis not present

## 2015-09-05 DIAGNOSIS — Z3A32 32 weeks gestation of pregnancy: Secondary | ICD-10-CM | POA: Insufficient documentation

## 2015-09-05 DIAGNOSIS — E119 Type 2 diabetes mellitus without complications: Secondary | ICD-10-CM | POA: Insufficient documentation

## 2015-09-05 NOTE — Progress Notes (Signed)
   Triage visit for NST   Megan Stanton is a 35 y.o. K8A0601. She is at [redacted]w[redacted]d gestation. She presents for a scheduled NST.  Indication: [name] Twins, insulin dependent Type 2 DM, Obesity, Prior IUFD  S: Resting comfortably. no CTX, no VB. Active fetal movement. No Concerns  O:  LMP 01/20/2015  No results found for this or any previous visit (from the past 48 hour(s)).   Gen: NAD, AAOx3      Abd: FNTTP      Ext: Non-tender, Nonedmeatous    FHT:  Twin A: Baseline: 135 bpm/Moderate variability/+accels/no decels Twin B: Baseline: 150 bpm/moderate variability/+accels/no decels TOCO: quiet SVE:  deferred   A/P:  36 y.o. [id number] [redacted]w[redacted]d with [name] Twins, insulin dependent Type 2 DM, Obesity, Prior IUFD   Reactive NST, with moderate variability and accelerations, no decels  Fetal Wellbeing: Reassuring  D/c home stable, precautions reviewed, follow-up as scheduled.   RTC on Thursday for NST  Call office after 1:30pm to schedule appt this week with Dr. Feliberto Gottron  Dr. Feliberto Gottron aware and agrees with plan  [name], CNM

## 2015-09-05 NOTE — Discharge Summary (Signed)
Discharge instructions reviewed with patient including follow up appointments, signs of preterm labor, and when to seek medical attention. All questions answered. Patient discharge home, ambulatory with steady gait, no signs of distress observed at time of discharge.

## 2015-09-08 ENCOUNTER — Encounter: Payer: Self-pay | Admitting: *Deleted

## 2015-09-08 ENCOUNTER — Inpatient Hospital Stay
Admission: RE | Admit: 2015-09-08 | Discharge: 2015-09-08 | Disposition: A | Discharge status: Home / Self Care | Payer: Medicaid Other | Attending: Obstetrics and Gynecology | Admitting: Obstetrics and Gynecology

## 2015-09-08 DIAGNOSIS — Z3A33 33 weeks gestation of pregnancy: Secondary | ICD-10-CM | POA: Insufficient documentation

## 2015-09-08 DIAGNOSIS — O09293 Supervision of pregnancy with other poor reproductive or obstetric history, third trimester: Secondary | ICD-10-CM | POA: Insufficient documentation

## 2015-09-08 DIAGNOSIS — IMO0001 Reserved for inherently not codable concepts without codable children: Secondary | ICD-10-CM

## 2015-09-08 DIAGNOSIS — O09523 Supervision of elderly multigravida, third trimester: Secondary | ICD-10-CM | POA: Diagnosis present

## 2015-09-08 DIAGNOSIS — O30041 Twin pregnancy, dichorionic/diamniotic, first trimester: Secondary | ICD-10-CM

## 2015-09-08 DIAGNOSIS — O30043 Twin pregnancy, dichorionic/diamniotic, third trimester: Secondary | ICD-10-CM | POA: Diagnosis not present

## 2015-09-08 DIAGNOSIS — O09521 Supervision of elderly multigravida, first trimester: Secondary | ICD-10-CM

## 2015-09-08 NOTE — OB Triage Note (Signed)
Pt at 33w gestation with [facility] twins presents for routine NST. No LOF, VB or contractions. +FM.

## 2015-09-08 NOTE — OB Triage Provider Note (Signed)
S: Resting comfortably.. No  CTX, no LOF, VB O:  Gen: NAD, AAOx3      Abd: FNTTP      Ext: Non-tender, Nonedmeatous    FHT: + mod var  Baby A and B+ accelerations no decelerations Babies are jumping on and off the monnitors and it is difficult to trace but, length of strip on Baby B is good and Baby A is reactice TOCO: None SVE: N/A   A/P:  35 y.o. yo G68P1020  With Di/Di Twins at [redacted]w[redacted]d for NST  Labor:None  FWB: Reassuring Cat 1 tracing for DiDi Twins. Reactive NST for Both Baby A and B  Dc home after Baby B is traced for 10 mins longer.  FU for OB care as scheduled.   Sharee Pimple 12:25 PM

## 2015-09-12 ENCOUNTER — Inpatient Hospital Stay
Admission: RE | Admit: 2015-09-12 | Discharge: 2015-09-12 | Disposition: A | Discharge status: Home / Self Care | Payer: Medicaid Other | Attending: Obstetrics and Gynecology | Admitting: Obstetrics and Gynecology

## 2015-09-12 DIAGNOSIS — O99213 Obesity complicating pregnancy, third trimester: Secondary | ICD-10-CM | POA: Diagnosis not present

## 2015-09-12 DIAGNOSIS — O24813 Other pre-existing diabetes mellitus in pregnancy, third trimester: Secondary | ICD-10-CM | POA: Diagnosis not present

## 2015-09-12 DIAGNOSIS — O30043 Twin pregnancy, dichorionic/diamniotic, third trimester: Secondary | ICD-10-CM | POA: Diagnosis not present

## 2015-09-12 DIAGNOSIS — Z3A33 33 weeks gestation of pregnancy: Secondary | ICD-10-CM | POA: Diagnosis not present

## 2015-09-12 NOTE — Progress Notes (Signed)
   Triage visit for NST   Megan Stanton is a 35 y.o. Z6X0960. She is at [redacted]w[redacted]d gestation. She presents for a scheduled NST.  Indication: [name] Twins, Insulin dependent Type @ DM, Obesity, Prior IUFD  S: Resting comfortably. no CTX, no VB. Active fetal movement. Concerned about left hip/back pain.  She denies ctxs.  O:  Resp 20   LMP 01/20/2015  No results found for this or any previous visit (from the past 48 hour(s)).   Gen: NAD, AAOx3      Abd: FNTTP      Ext: Non-tender, Nonedmeatous    FHT:  Twin A: Baseline: 135 bpm / Moderate variability/ +accels/ no decels Twin B: Baseline: 145 bpm / Moderate variability/ +accels/ no decels TOCO: x1 UC SVE:  deferred   A/P:  36 y.o. A5W0981 [redacted]w[redacted]d with [name] Twins, Insulin dependent Type @ DM, Obesity, Prior IUFD   Reactive NST, with moderate variability and accelerations, no decels  Fetal Wellbeing: Reassuring  Advised heat and Tylenol for left hip/back pain, f/u for worsening s/s/ of pain   D/c home stable, precautions reviewed, follow-up as scheduled.  Appt with Dr. Feliberto Gottron and [place] for AFI on 09/14/15 and NST on Thursday 8/3 at 11am   Dr. Feliberto Gottron aware and agrees with plan  [name], CNM

## 2015-09-16 ENCOUNTER — Encounter: Payer: Self-pay | Admitting: *Deleted

## 2015-09-16 ENCOUNTER — Observation Stay
Admission: RE | Admit: 2015-09-16 | Discharge: 2015-09-16 | Disposition: A | Discharge status: Home / Self Care | Payer: Medicaid Other | Attending: Obstetrics and Gynecology | Admitting: Obstetrics and Gynecology

## 2015-09-16 DIAGNOSIS — IMO0001 Reserved for inherently not codable concepts without codable children: Secondary | ICD-10-CM

## 2015-09-16 DIAGNOSIS — O09293 Supervision of pregnancy with other poor reproductive or obstetric history, third trimester: Secondary | ICD-10-CM

## 2015-09-16 DIAGNOSIS — O24414 Gestational diabetes mellitus in pregnancy, insulin controlled: Principal | ICD-10-CM | POA: Insufficient documentation

## 2015-09-16 DIAGNOSIS — O09521 Supervision of elderly multigravida, first trimester: Secondary | ICD-10-CM

## 2015-09-16 DIAGNOSIS — O30041 Twin pregnancy, dichorionic/diamniotic, first trimester: Secondary | ICD-10-CM

## 2015-09-19 ENCOUNTER — Inpatient Hospital Stay
Admission: RE | Admit: 2015-09-19 | Discharge: 2015-09-19 | Disposition: A | Discharge status: Home / Self Care | Payer: Medicaid Other | Attending: Obstetrics and Gynecology | Admitting: Obstetrics and Gynecology

## 2015-09-19 ENCOUNTER — Encounter: Payer: Self-pay | Admitting: *Deleted

## 2015-09-19 DIAGNOSIS — Z794 Long term (current) use of insulin: Secondary | ICD-10-CM | POA: Insufficient documentation

## 2015-09-19 DIAGNOSIS — IMO0001 Reserved for inherently not codable concepts without codable children: Secondary | ICD-10-CM

## 2015-09-19 DIAGNOSIS — Z3A34 34 weeks gestation of pregnancy: Secondary | ICD-10-CM | POA: Diagnosis not present

## 2015-09-19 DIAGNOSIS — O99213 Obesity complicating pregnancy, third trimester: Secondary | ICD-10-CM | POA: Diagnosis not present

## 2015-09-19 DIAGNOSIS — O30041 Twin pregnancy, dichorionic/diamniotic, first trimester: Secondary | ICD-10-CM

## 2015-09-19 DIAGNOSIS — O09521 Supervision of elderly multigravida, first trimester: Secondary | ICD-10-CM

## 2015-09-19 DIAGNOSIS — E119 Type 2 diabetes mellitus without complications: Secondary | ICD-10-CM | POA: Insufficient documentation

## 2015-09-19 DIAGNOSIS — E669 Obesity, unspecified: Secondary | ICD-10-CM | POA: Insufficient documentation

## 2015-09-19 DIAGNOSIS — O30043 Twin pregnancy, dichorionic/diamniotic, third trimester: Secondary | ICD-10-CM | POA: Insufficient documentation

## 2015-09-19 DIAGNOSIS — O09293 Supervision of pregnancy with other poor reproductive or obstetric history, third trimester: Secondary | ICD-10-CM

## 2015-09-19 NOTE — OB Triage Note (Signed)
Scheduled NST for twins

## 2015-09-19 NOTE — Progress Notes (Signed)
   Triage visit for NST   Alinda Doomseresa W Graziani is a 35 y.o. G5P1010. She is at 1911w4d gestation. She presents for a scheduled NST.  She is having twice weekly NSTs, and this is the first NST of the week  Indication: Mercie Eoni Di Twins, Insulin dependent Type @ DM, Obesity, Prior IUFD  S: Resting comfortably. no CTX, no VB. Active fetal movement.  O:  Ht 5\' 9"  (1.753 m)   LMP 01/20/2015  No results found for this or any previous visit (from the past 48 hour(s)).   Gen: NAD, AAOx3      Abd: FNTTP      Ext: Non-tender, Nonedmeatous    FHT:  Twin A (blue): Baseline: 145 bpm  /Moderate variability/+accels/1 small variable, but resolved  Twin B (yellow): Baseline: 155bpm /Moderate variability/+accels/no decels TOCO: x 1 UC SVE:  deferred   A/P:  35 y.o. G5P1010 5711w4d with Mercie Eoni Di Twins, Insulin dependent Type @ DM, Obesity, Prior IUFD   Reactive NST, with moderate variability and accelerations, no decels  Fetal Wellbeing: Reassuring  D/c home stable, precautions reviewed, follow-up as scheduled.   F/U for US and appt with Dr. Dalbert GarnetBeasley on Wednesday and repeat NST Thursday  [facility]  Preterm labor precautions and warning s/s reviewe  Dr. Dalbert GarnetBeasley aware and agrees with plan  Carlean JewsMeredith Jaymari Cromie, CNM

## 2015-09-19 NOTE — Discharge Summary (Signed)
Patient discharged home, discharge instructions given, patient states understanding. Patient left floor in stable condition, denies any other needs at this time. Patient to keep next scheduled OB appointment and [place] Wednesday and NST in [facility] Thursday at 1000

## 2015-09-21 LAB — OB RESULTS CONSOLE GBS: STREP GROUP B AG: NEGATIVE

## 2015-09-21 LAB — OB RESULTS CONSOLE GC/CHLAMYDIA
Chlamydia: NEGATIVE
GC PROBE AMP, GENITAL: NEGATIVE

## 2015-09-22 ENCOUNTER — Inpatient Hospital Stay
Admission: RE | Admit: 2015-09-22 | Discharge: 2015-09-22 | Disposition: A | Discharge status: Home / Self Care | Payer: Medicaid Other | Attending: Obstetrics and Gynecology | Admitting: Obstetrics and Gynecology

## 2015-09-22 DIAGNOSIS — O24813 Other pre-existing diabetes mellitus in pregnancy, third trimester: Secondary | ICD-10-CM | POA: Insufficient documentation

## 2015-09-22 DIAGNOSIS — O30043 Twin pregnancy, dichorionic/diamniotic, third trimester: Secondary | ICD-10-CM | POA: Diagnosis not present

## 2015-09-22 DIAGNOSIS — O99213 Obesity complicating pregnancy, third trimester: Secondary | ICD-10-CM | POA: Insufficient documentation

## 2015-09-22 DIAGNOSIS — Z794 Long term (current) use of insulin: Secondary | ICD-10-CM | POA: Diagnosis not present

## 2015-09-22 DIAGNOSIS — Z3A35 35 weeks gestation of pregnancy: Secondary | ICD-10-CM | POA: Insufficient documentation

## 2015-09-22 NOTE — Discharge Summary (Signed)
Discharge instructions reviewed with patient including follow up appointments, preterm labor precautions and when to seek medical attention. All questions answered. No signs of distress observed at time of discharge. Patient discharged via wheelchair, escorted by volunteer staff.

## 2015-09-22 NOTE — Progress Notes (Signed)
   Triage visit for NST   Megan Stanton is a 35 y.o. G5P1010. She is at 6893w0d gestation. She presents for a scheduled NST.  This is her second NST of the week.   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 no CTX, no VB. Active fetal movement.        No concerns.  Has had ultrasound and seen BEB this week and NST O:  LMP 01/20/2015  No results found for this or any previous visit (from the past 48 hour(s)).   Gen: NAD, AAOx3      Abd: FNTTP      Ext: Non-tender, Nonedmeatous    FHT:  Twin A (blue): Baseline: 135-140 bpm / Moderate variability/ +accels/ no decels Twin B (yellow): Baseline: 150-155 bpm / Moderate variability/ +accels/ no decels TOCO: quiet SVE: deferred   A/P:  35 y.o. 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   Megan Stanton, CNM

## 2015-09-26 NOTE — Discharge Summary (Signed)
  Insulin dependent GDM  Twins. Reactive A+B today NST

## 2015-09-27 ENCOUNTER — Inpatient Hospital Stay
Admission: RE | Admit: 2015-09-27 | Discharge: 2015-09-27 | Disposition: A | Discharge status: Home / Self Care | Payer: Medicaid Other | Attending: Obstetrics and Gynecology | Admitting: Obstetrics and Gynecology

## 2015-09-27 DIAGNOSIS — O30041 Twin pregnancy, dichorionic/diamniotic, first trimester: Secondary | ICD-10-CM

## 2015-09-27 DIAGNOSIS — IMO0001 Reserved for inherently not codable concepts without codable children: Secondary | ICD-10-CM

## 2015-09-27 DIAGNOSIS — O09293 Supervision of pregnancy with other poor reproductive or obstetric history, third trimester: Secondary | ICD-10-CM

## 2015-09-27 DIAGNOSIS — O09521 Supervision of elderly multigravida, first trimester: Secondary | ICD-10-CM

## 2015-09-27 NOTE — Progress Notes (Signed)
   Triage visit for NST   Megan Stanton is a 35 y.o. G5P1010. She is at 3630w5d gestation. She presents for a scheduled NST.  Indication: DiDi Twin IUP- hx of IUFD, T2DM, obesity, abnormal MSAFP with normal anatomy scans Is getting twice weekly NST and weekly AFI from 28wk  S: Resting comfortably. no CTX, no VB. Active fetal movement. O:  BP 123/71 (BP Location: Left Arm)   Pulse 96   Temp 98.5 F (36.9 C) (Oral)   Resp 18   Ht 5\' 9"  (1.753 m)   Wt (!) 303 lb (137.4 kg)   LMP 01/20/2015   BMI 44.75 kg/m  No results found for this or any previous visit (from the past 48 hour(s)).   Gen: NAD, AAOx3      Abd: FNTTP      Ext: Non-tender, Nonedmeatous    FHT: Baby A: 140s, mod var, +accels, no decels Baby B: 150s, mod var, +accels, no decels TOCO: quiet SVE:  deferred   A/P:  35 y.o. G5P1010 3030w5d with DiDi Twin IUP- hx of IUFD, T2DM, obesity, abnormal MSAFP with normal anatomy scans   Reactive NST x2, with moderate variability and accelerations, no decels  Fetal Wellbeing: Reassuring x2  D/c home stable, precautions reviewed, follow-up as scheduled.

## 2015-09-30 ENCOUNTER — Observation Stay
Admission: RE | Admit: 2015-09-30 | Discharge: 2015-09-30 | Disposition: A | Discharge status: Home / Self Care | Payer: Medicaid Other | Source: Intra-hospital | Attending: Obstetrics and Gynecology | Admitting: Obstetrics and Gynecology

## 2015-09-30 DIAGNOSIS — O09521 Supervision of elderly multigravida, first trimester: Secondary | ICD-10-CM

## 2015-09-30 DIAGNOSIS — O30041 Twin pregnancy, dichorionic/diamniotic, first trimester: Secondary | ICD-10-CM

## 2015-09-30 DIAGNOSIS — O30043 Twin pregnancy, dichorionic/diamniotic, third trimester: Principal | ICD-10-CM | POA: Insufficient documentation

## 2015-09-30 DIAGNOSIS — Z3A36 36 weeks gestation of pregnancy: Secondary | ICD-10-CM | POA: Diagnosis not present

## 2015-09-30 DIAGNOSIS — O99213 Obesity complicating pregnancy, third trimester: Secondary | ICD-10-CM | POA: Diagnosis not present

## 2015-09-30 DIAGNOSIS — IMO0001 Reserved for inherently not codable concepts without codable children: Secondary | ICD-10-CM

## 2015-09-30 DIAGNOSIS — O24913 Unspecified diabetes mellitus in pregnancy, third trimester: Secondary | ICD-10-CM | POA: Diagnosis not present

## 2015-09-30 DIAGNOSIS — O09293 Supervision of pregnancy with other poor reproductive or obstetric history, third trimester: Secondary | ICD-10-CM

## 2015-09-30 NOTE — Progress Notes (Signed)
   Triage visit for NST - this is the second NST of the week   Megan Stanton is a 35 y.o. G5P1010. She is at 8226w1d gestation. She presents for a scheduled NST.  Indication: DiDi Twin IUP- hx of IUFD, T2DM, obesity, abnormal MSAFP with normal anatomy scans Is getting twice weekly NST and weekly AFI from 28wk  S: Resting comfortably. no CTX, no VB. Active fetal movement.  O:  Temp 98 F (36.7 C) (Oral)   LMP 01/20/2015  No results found for this or any previous visit (from the past 48 hour(s)).   Gen: NAD, AAOx3      Abd: FNTTP      Ext: Non-tender, Nonedmeatous    FHT:  Baby A: Baseline: 135 bpm / moderate variability/ + accels/ no decels Baby B: Baseline: 140s-150 bpm/ moderate variability/ +accels/ no decels  TOCO: quiet SVE:  deferred   A/P:  35 y.o. G5P1010 4826w1d with DiDi Twin IUP- hx of IUFD, T2DM, obesity, abnormal MSAFP with normal anatomy scans Is getting twice weekly NST and weekly AFI from 28wk   Reactive NST, with moderate variability and accelerations, no decels  Fetal Wellbeing: Reassuring  D/c home stable, precautions reviewed, follow-up as scheduled.   Dr. Feliberto GottronSchermerhorn aware and agrees with plan  Carlean JewsMeredith Ashtin Rosner, CNM

## 2015-09-30 NOTE — Discharge Summary (Signed)
Pt discharged home in stable condition. No c/o pain or contractions. Volunteer transported pt via wheelchair to visitors entrance. Verbalized understanding of d/c instructions. Will return for NST Monday. No other concerns.

## 2015-10-03 ENCOUNTER — Encounter: Payer: Self-pay | Admitting: *Deleted

## 2015-10-03 ENCOUNTER — Observation Stay
Admission: RE | Admit: 2015-10-03 | Discharge: 2015-10-03 | Disposition: A | Discharge status: Home / Self Care | Payer: Medicaid Other | Attending: Obstetrics and Gynecology | Admitting: Obstetrics and Gynecology

## 2015-10-03 DIAGNOSIS — O30041 Twin pregnancy, dichorionic/diamniotic, first trimester: Secondary | ICD-10-CM

## 2015-10-03 DIAGNOSIS — Z3A36 36 weeks gestation of pregnancy: Secondary | ICD-10-CM | POA: Insufficient documentation

## 2015-10-03 DIAGNOSIS — O24419 Gestational diabetes mellitus in pregnancy, unspecified control: Principal | ICD-10-CM | POA: Diagnosis present

## 2015-10-03 DIAGNOSIS — IMO0001 Reserved for inherently not codable concepts without codable children: Secondary | ICD-10-CM

## 2015-10-03 DIAGNOSIS — O30003 Twin pregnancy, unspecified number of placenta and unspecified number of amniotic sacs, third trimester: Secondary | ICD-10-CM | POA: Insufficient documentation

## 2015-10-03 DIAGNOSIS — O09521 Supervision of elderly multigravida, first trimester: Secondary | ICD-10-CM

## 2015-10-03 DIAGNOSIS — O09293 Supervision of pregnancy with other poor reproductive or obstetric history, third trimester: Secondary | ICD-10-CM

## 2015-10-03 NOTE — OB Triage Note (Signed)
Pt. Here for weekly NST.

## 2015-10-03 NOTE — Progress Notes (Addendum)
Patient ID: Megan Stanton, female   DOB: 07-10-1980, 35 y.o.   MRN: [id number] Megan Stanton 07-10-1980 G5 P1010 7341w4d presents for NST  Pt with GDM , obesity , h/o IUFD , Twins   , no vaginal bleeding ,also c/o vaginal leakage  O;BP (!) 144/82 (BP Location: Left Arm)   Pulse 94   Temp 98.9 F (37.2 C) (Oral)   Resp 18   LMP 01/20/2015    repeat BP 125/72 ABDsoft NT gravid CX not checked  NST reactive Baby A + BAby B  Labs: nitrazine ne A: High risk pregnancy third trimester  Twin gestation   no evidence of LOF  Reassuring fetal monitoring Baby A+B  P:NST x2 both A+B

## 2015-10-03 NOTE — Discharge Summary (Signed)
  Megan Stanton [place] 09/28/1980 G5 P1010 9074w4d presents for NST  Pt with GDM , obesity , h/o IUFD , Twins   , no vaginal bleeding ,also c/o vaginal leakage  O;BP (!) 144/82 (BP Location: Left Arm)   Pulse 94   Temp 98.9 F (37.2 C) (Oral)   Resp 18   LMP 01/20/2015    repeat BP 125/72 ABDsoft NT gravid CX not checked  NST reactive Baby A + BAby B  Labs: nitrazine ne A: High risk pregnancy third trimester  Twin gestation   no evidence of LOF  Reassuring fetal monitoring Baby A+B  P:NST x2 both A+B         Admission (Current) on 10/03/2015        Revision History        Detailed Report

## 2015-10-03 NOTE — Progress Notes (Signed)
Pt. Thinks she maybe leaking fluid or "it may be pee".  Nitrazine neg.

## 2015-10-07 ENCOUNTER — Encounter
Admission: EM | Disposition: A | Discharge status: Home / Self Care | Payer: Self-pay | Source: Home / Self Care | Attending: Obstetrics and Gynecology

## 2015-10-07 ENCOUNTER — Inpatient Hospital Stay: Payer: Medicaid Other | Admitting: Anesthesiology

## 2015-10-07 ENCOUNTER — Encounter: Payer: Self-pay | Admitting: *Deleted

## 2015-10-07 ENCOUNTER — Inpatient Hospital Stay
Admission: EM | Admit: 2015-10-07 | Discharge: 2015-10-09 | DRG: 774 | Disposition: A | Discharge status: Home / Self Care | Payer: Medicaid Other | Attending: Obstetrics and Gynecology | Admitting: Obstetrics and Gynecology

## 2015-10-07 DIAGNOSIS — Z8249 Family history of ischemic heart disease and other diseases of the circulatory system: Secondary | ICD-10-CM | POA: Diagnosis not present

## 2015-10-07 DIAGNOSIS — Z3A37 37 weeks gestation of pregnancy: Secondary | ICD-10-CM | POA: Diagnosis not present

## 2015-10-07 DIAGNOSIS — E119 Type 2 diabetes mellitus without complications: Secondary | ICD-10-CM | POA: Diagnosis present

## 2015-10-07 DIAGNOSIS — O2492 Unspecified diabetes mellitus in childbirth: Secondary | ICD-10-CM | POA: Diagnosis present

## 2015-10-07 DIAGNOSIS — Z833 Family history of diabetes mellitus: Secondary | ICD-10-CM

## 2015-10-07 DIAGNOSIS — O30043 Twin pregnancy, dichorionic/diamniotic, third trimester: Secondary | ICD-10-CM | POA: Diagnosis present

## 2015-10-07 DIAGNOSIS — O09521 Supervision of elderly multigravida, first trimester: Secondary | ICD-10-CM

## 2015-10-07 DIAGNOSIS — O30041 Twin pregnancy, dichorionic/diamniotic, first trimester: Secondary | ICD-10-CM

## 2015-10-07 DIAGNOSIS — O09293 Supervision of pregnancy with other poor reproductive or obstetric history, third trimester: Secondary | ICD-10-CM

## 2015-10-07 DIAGNOSIS — IMO0001 Reserved for inherently not codable concepts without codable children: Secondary | ICD-10-CM

## 2015-10-07 LAB — GLUCOSE, CAPILLARY
GLUCOSE-CAPILLARY: 149 mg/dL — AB (ref 65–99)
GLUCOSE-CAPILLARY: 113 mg/dL — AB (ref 65–99)
GLUCOSE-CAPILLARY: 120 mg/dL — AB (ref 65–99)
Glucose-Capillary: 119 mg/dL — ABNORMAL HIGH (ref 65–99)
Glucose-Capillary: 196 mg/dL — ABNORMAL HIGH (ref 65–99)

## 2015-10-07 LAB — CBC
HCT: 33.9 % — ABNORMAL LOW (ref 35.0–47.0)
Hemoglobin: 11.6 g/dL — ABNORMAL LOW (ref 12.0–16.0)
MCH: 28.7 pg (ref 26.0–34.0)
MCHC: 34.2 g/dL (ref 32.0–36.0)
MCV: 83.9 fL (ref 80.0–100.0)
PLATELETS: 114 10*3/uL — AB (ref 150–440)
RBC: 4.04 MIL/uL (ref 3.80–5.20)
RDW: 14.7 % — AB (ref 11.5–14.5)
WBC: 8.3 10*3/uL (ref 3.6–11.0)

## 2015-10-07 LAB — TYPE AND SCREEN
ABO/RH(D): A POS
Antibody Screen: NEGATIVE

## 2015-10-07 SURGERY — Surgical Case
Anesthesia: Choice

## 2015-10-07 MED ORDER — NALBUPHINE HCL 10 MG/ML IJ SOLN: 5.0000 mg | INTRAMUSCULAR | Status: DC | PRN

## 2015-10-07 MED ORDER — HYDROCODONE-ACETAMINOPHEN 5-325 MG PO TABS
1.0000 | ORAL_TABLET | ORAL | Status: DC | PRN
Start: 2015-10-07 — End: 2015-10-10

## 2015-10-07 MED ORDER — BISACODYL 10 MG RE SUPP: 10.0000 mg | Freq: Every day | RECTAL | Status: DC | PRN

## 2015-10-07 MED ORDER — SODIUM CHLORIDE 0.9 % IV SOLN: 250.0000 mL | INTRAVENOUS | Status: DC | PRN

## 2015-10-07 MED ORDER — FENTANYL 2.5 MCG/ML W/ROPIVACAINE 0.2% IN NS 100 ML EPIDURAL INFUSION (ARMC-ANES): 10.0000 mL/h | EPIDURAL | Status: DC

## 2015-10-07 MED ORDER — LIDOCAINE HCL (PF) 1 % IJ SOLN
INTRAMUSCULAR | Status: DC | PRN
  Administered 2015-10-07: 3 mL via SUBCUTANEOUS

## 2015-10-07 MED ORDER — FLEET ENEMA 7-19 GM/118ML RE ENEM: 1.0000 | ENEMA | RECTAL | Status: DC | PRN

## 2015-10-07 MED ORDER — ONDANSETRON HCL 4 MG/2ML IJ SOLN: 4.0000 mg | INTRAMUSCULAR | Status: DC | PRN

## 2015-10-07 MED ORDER — BUTORPHANOL TARTRATE 1 MG/ML IJ SOLN
1.0000 mg | INTRAMUSCULAR | Status: DC | PRN
  Filled 2015-10-07: qty 1

## 2015-10-07 MED ORDER — SODIUM CHLORIDE 0.9% FLUSH
3.0000 mL | Freq: Two times a day (BID) | INTRAVENOUS | Status: DC
  Administered 2015-10-08: 3 mL via INTRAVENOUS

## 2015-10-07 MED ORDER — TERBUTALINE SULFATE 1 MG/ML IJ SOLN: 0.2500 mg | Freq: Once | INTRAMUSCULAR | Status: DC | PRN

## 2015-10-07 MED ORDER — LIDOCAINE HCL (PF) 1 % IJ SOLN
INTRAMUSCULAR | Status: AC
  Filled 2015-10-07: qty 30

## 2015-10-07 MED ORDER — OXYTOCIN BOLUS FROM INFUSION
500.0000 mL | Freq: Once | INTRAVENOUS | Status: DC
  Administered 2015-10-07: 500 mL via INTRAVENOUS

## 2015-10-07 MED ORDER — ONDANSETRON HCL 4 MG/2ML IJ SOLN
4.0000 mg | Freq: Four times a day (QID) | INTRAMUSCULAR | Status: DC | PRN
  Administered 2015-10-07: 4 mg via INTRAVENOUS
  Filled 2015-10-07: qty 2

## 2015-10-07 MED ORDER — WITCH HAZEL-GLYCERIN EX PADS: 1.0000 "application " | MEDICATED_PAD | CUTANEOUS | Status: DC | PRN

## 2015-10-07 MED ORDER — SODIUM CHLORIDE 0.9% FLUSH: 3.0000 mL | INTRAVENOUS | Status: DC | PRN

## 2015-10-07 MED ORDER — LACTATED RINGERS IV SOLN: 500.0000 mL | INTRAVENOUS | Status: DC | PRN

## 2015-10-07 MED ORDER — FLEET ENEMA 7-19 GM/118ML RE ENEM: 1.0000 | ENEMA | Freq: Every day | RECTAL | Status: DC | PRN

## 2015-10-07 MED ORDER — NALBUPHINE HCL 10 MG/ML IJ SOLN: 5.0000 mg | Freq: Once | INTRAMUSCULAR | Status: DC | PRN

## 2015-10-07 MED ORDER — PRENATAL MULTIVITAMIN CH
1.0000 | ORAL_TABLET | Freq: Every day | ORAL | Status: DC
  Administered 2015-10-08 – 2015-10-09 (×2): 1 via ORAL
  Filled 2015-10-07 (×2): qty 1

## 2015-10-07 MED ORDER — BENZOCAINE-MENTHOL 20-0.5 % EX AERO: 1.0000 "application " | INHALATION_SPRAY | CUTANEOUS | Status: DC | PRN

## 2015-10-07 MED ORDER — OXYTOCIN 40 UNITS IN LACTATED RINGERS INFUSION - SIMPLE MED
2.5000 [IU]/h | INTRAVENOUS | Status: DC
  Administered 2015-10-07: 2.5 [IU]/h via INTRAVENOUS

## 2015-10-07 MED ORDER — FENTANYL 2.5 MCG/ML W/ROPIVACAINE 0.2% IN NS 100 ML EPIDURAL INFUSION (ARMC-ANES)
EPIDURAL | Status: AC
  Filled 2015-10-07: qty 100

## 2015-10-07 MED ORDER — AMMONIA AROMATIC IN INHA
RESPIRATORY_TRACT | Status: AC
  Filled 2015-10-07: qty 10

## 2015-10-07 MED ORDER — OXYTOCIN 40 UNITS IN LACTATED RINGERS INFUSION - SIMPLE MED: 1.0000 m[IU]/min | INTRAVENOUS | Status: DC

## 2015-10-07 MED ORDER — HYDROCODONE-ACETAMINOPHEN 5-325 MG PO TABS: 2.0000 | ORAL_TABLET | ORAL | Status: DC | PRN

## 2015-10-07 MED ORDER — SENNOSIDES-DOCUSATE SODIUM 8.6-50 MG PO TABS
2.0000 | ORAL_TABLET | ORAL | Status: DC
  Administered 2015-10-07: 2 via ORAL
  Filled 2015-10-07: qty 2

## 2015-10-07 MED ORDER — MISOPROSTOL 200 MCG PO TABS
ORAL_TABLET | ORAL | Status: AC
  Filled 2015-10-07: qty 4

## 2015-10-07 MED ORDER — ONDANSETRON HCL 4 MG PO TABS: 4.0000 mg | ORAL_TABLET | ORAL | Status: DC | PRN

## 2015-10-07 MED ORDER — ACETAMINOPHEN 325 MG PO TABS: 650.0000 mg | ORAL_TABLET | ORAL | Status: DC | PRN

## 2015-10-07 MED ORDER — INSULIN DETEMIR 100 UNIT/ML ~~LOC~~ SOLN
25.0000 [IU] | Freq: Every day | SUBCUTANEOUS | Status: DC
  Administered 2015-10-08 – 2015-10-09 (×2): 25 [IU] via SUBCUTANEOUS
  Filled 2015-10-07 (×4): qty 0.25

## 2015-10-07 MED ORDER — TETANUS-DIPHTH-ACELL PERTUSSIS 5-2.5-18.5 LF-MCG/0.5 IM SUSP: 0.5000 mL | Freq: Once | INTRAMUSCULAR | Status: DC

## 2015-10-07 MED ORDER — DIBUCAINE 1 % RE OINT: 1.0000 "application " | TOPICAL_OINTMENT | RECTAL | Status: DC | PRN

## 2015-10-07 MED ORDER — SOD CITRATE-CITRIC ACID 500-334 MG/5ML PO SOLN: 30.0000 mL | ORAL | Status: DC | PRN

## 2015-10-07 MED ORDER — DEXTROSE IN LACTATED RINGERS 5 % IV SOLN: INTRAVENOUS | Status: DC

## 2015-10-07 MED ORDER — DIPHENHYDRAMINE HCL 25 MG PO CAPS: 25.0000 mg | ORAL_CAPSULE | ORAL | Status: DC | PRN

## 2015-10-07 MED ORDER — OXYTOCIN 40 UNITS IN LACTATED RINGERS INFUSION - SIMPLE MED
1.0000 m[IU]/min | INTRAVENOUS | Status: DC
  Administered 2015-10-07: 1 m[IU]/min via INTRAVENOUS

## 2015-10-07 MED ORDER — IBUPROFEN 600 MG PO TABS
600.0000 mg | ORAL_TABLET | Freq: Four times a day (QID) | ORAL | Status: DC
  Administered 2015-10-07 – 2015-10-08 (×2): 600 mg via ORAL
  Filled 2015-10-07 (×3): qty 1

## 2015-10-07 MED ORDER — INSULIN ASPART 100 UNIT/ML ~~LOC~~ SOLN
0.0000 [IU] | Freq: Three times a day (TID) | SUBCUTANEOUS | Status: DC
  Administered 2015-10-08: 11 [IU] via SUBCUTANEOUS
  Administered 2015-10-08: 3 [IU] via SUBCUTANEOUS
  Administered 2015-10-08: 7 [IU] via SUBCUTANEOUS
  Administered 2015-10-09: 11 [IU] via SUBCUTANEOUS
  Administered 2015-10-09 (×2): 4 [IU] via SUBCUTANEOUS
  Filled 2015-10-07 (×3): qty 0.2
  Filled 2015-10-07: qty 11
  Filled 2015-10-07 (×7): qty 0.2
  Filled 2015-10-07: qty 7
  Filled 2015-10-07: qty 11
  Filled 2015-10-07 (×3): qty 0.2
  Filled 2015-10-07: qty 3
  Filled 2015-10-07 (×2): qty 0.2
  Filled 2015-10-07 (×2): qty 4

## 2015-10-07 MED ORDER — SODIUM CHLORIDE 0.9 % IV SOLN
INTRAVENOUS | Status: DC
  Administered 2015-10-08: 02:00:00 via INTRAVENOUS

## 2015-10-07 MED ORDER — DIPHENHYDRAMINE HCL 50 MG/ML IJ SOLN: 12.5000 mg | INTRAMUSCULAR | Status: DC | PRN

## 2015-10-07 MED ORDER — NALOXONE HCL 0.4 MG/ML IJ SOLN: 0.4000 mg | INTRAMUSCULAR | Status: DC | PRN

## 2015-10-07 MED ORDER — MEASLES, MUMPS & RUBELLA VAC ~~LOC~~ INJ
0.5000 mL | INJECTION | Freq: Once | SUBCUTANEOUS | Status: DC
  Filled 2015-10-07: qty 0.5

## 2015-10-07 MED ORDER — ZOLPIDEM TARTRATE 5 MG PO TABS: 5.0000 mg | ORAL_TABLET | Freq: Every evening | ORAL | Status: DC | PRN

## 2015-10-07 MED ORDER — SIMETHICONE 80 MG PO CHEW: 80.0000 mg | CHEWABLE_TABLET | ORAL | Status: DC | PRN

## 2015-10-07 MED ORDER — DIPHENHYDRAMINE HCL 25 MG PO CAPS: 25.0000 mg | ORAL_CAPSULE | Freq: Four times a day (QID) | ORAL | Status: DC | PRN

## 2015-10-07 MED ORDER — NALOXONE HCL 2 MG/2ML IJ SOSY
1.0000 ug/kg/h | PREFILLED_SYRINGE | INTRAVENOUS | Status: DC | PRN
  Filled 2015-10-07: qty 2

## 2015-10-07 MED ORDER — LIDOCAINE-EPINEPHRINE (PF) 1.5 %-1:200000 IJ SOLN
INTRAMUSCULAR | Status: DC | PRN
  Administered 2015-10-07: 3 mL via EPIDURAL

## 2015-10-07 MED ORDER — BUPIVACAINE HCL (PF) 0.25 % IJ SOLN
INTRAMUSCULAR | Status: DC | PRN
  Administered 2015-10-07: 5 mL via EPIDURAL

## 2015-10-07 MED ORDER — LACTATED RINGERS IV SOLN
INTRAVENOUS | Status: DC
  Administered 2015-10-07 (×2): via INTRAVENOUS

## 2015-10-07 MED ORDER — FENTANYL 2.5 MCG/ML W/ROPIVACAINE 0.2% IN NS 100 ML EPIDURAL INFUSION (ARMC-ANES)
EPIDURAL | Status: DC | PRN
Start: 2015-10-07 — End: 2015-10-10
  Administered 2015-10-07: 9 mL/h via EPIDURAL

## 2015-10-07 MED ORDER — LIDOCAINE HCL (PF) 1 % IJ SOLN: 30.0000 mL | INTRAMUSCULAR | Status: DC | PRN

## 2015-10-07 MED ORDER — OXYTOCIN 40 UNITS IN LACTATED RINGERS INFUSION - SIMPLE MED
INTRAVENOUS | Status: AC
  Administered 2015-10-07: 1 m[IU]/min via INTRAVENOUS
  Filled 2015-10-07: qty 1000

## 2015-10-07 MED ORDER — COCONUT OIL OIL: 1.0000 "application " | TOPICAL_OIL | Status: DC | PRN

## 2015-10-07 SURGICAL SUPPLY — 22 items
CANISTER SUCT 3000ML (MISCELLANEOUS) IMPLANT
CATH KIT ON-Q SILVERSOAK 5IN (CATHETERS) IMPLANT
CHLORAPREP W/TINT 26ML (MISCELLANEOUS) IMPLANT
DRSG TELFA 3X8 NADH (GAUZE/BANDAGES/DRESSINGS) IMPLANT
ELECT REM PT RETURN 9FT ADLT (ELECTROSURGICAL)
ELECTRODE REM PT RTRN 9FT ADLT (ELECTROSURGICAL) IMPLANT
GAUZE SPONGE 4X4 12PLY STRL (GAUZE/BANDAGES/DRESSINGS) IMPLANT
GOWN STRL REUS W/ TWL LRG LVL3 (GOWN DISPOSABLE) IMPLANT
GOWN STRL REUS W/TWL LRG LVL3 (GOWN DISPOSABLE)
NS IRRIG 1000ML POUR BTL (IV SOLUTION) IMPLANT
PAD OB MATERNITY 4.3X12.25 (PERSONAL CARE ITEMS) IMPLANT
PAD PREP 24X41 OB/GYN DISP (PERSONAL CARE ITEMS) IMPLANT
SUT MNCRL 4-0 (SUTURE)
SUT MNCRL 4-0 27XMFL (SUTURE)
SUT PDS AB 1 TP1 96 (SUTURE) IMPLANT
SUT PLAIN 2 0 XLH (SUTURE) IMPLANT
SUT PLAIN GUT 2-0 30 C14 SG823 (SUTURE)
SUT VIC AB 0 CT1 36 (SUTURE) IMPLANT
SUT VIC AB 3-0 SH 27 (SUTURE)
SUT VIC AB 3-0 SH 27X BRD (SUTURE) IMPLANT
SUTURE MNCRL 4-0 27XMF (SUTURE) IMPLANT
SUTURE PLN GUT2-0 30 C14 SG823 (SUTURE) IMPLANT

## 2015-10-07 NOTE — Anesthesia Procedure Notes (Signed)
Epidural Patient location during procedure: OB  Staffing Anesthesiologist: Rosaria FerriesPISCITELLO, JOSEPH K Resident/CRNA: Mathews ArgyleLOGAN, Delford Wingert Performed: resident/CRNA   Preanesthetic Checklist Completed: patient identified, site marked, surgical consent, pre-op evaluation, timeout performed, IV checked, risks and benefits discussed and monitors and equipment checked  Epidural Patient position: sitting Prep: ChloraPrep and site prepped and draped Patient monitoring: heart rate, continuous pulse ox and blood pressure Approach: midline Location: L4-L5 Injection technique: LOR saline  Needle:  Needle type: Tuohy  Needle gauge: 17 G Needle length: 9 cm and 9 Needle insertion depth: 9 cm Catheter type: closed end flexible Catheter size: 19 Gauge Catheter at skin depth: 14 cm Test dose: negative and 1.5% lidocaine with Epi 1:200 K  Assessment Events: blood not aspirated, injection not painful, no injection resistance, negative IV test and no paresthesia  Additional Notes   Patient tolerated the insertion well without complications.Reason for block:procedure for pain

## 2015-10-07 NOTE — H&P (Signed)
Alinda Doomseresa W Forbis is a 35 y.o. female presenting for IOL/ She PROM this morning. Good fetal movement x2, no vaginal bleeding.  Preg c/b:  1. DiDi TIUP - fetal echo 06/30/15 wnl x2 2. Hx of IUFD 3. Morbid obesity 4. Pre-pregnancy Type 2 DM: currently insulin needs: - am 54 of N and 24 of R - Lunch 24 of R - Dinner 24 of R - qhs 38 of N On admission, her FBG was 149  5. 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 Abnormal MSAFP- Sono by MFM neg for ONTD and gastroschisis. Weekly testing from 28 wks with planned delivery by 37wks 7. Hx of multiple pregnancy loss, including IUFD 8. Rubella non-immune  OB History    Gravida Para Term Preterm AB Living   5 2 1 1 2  0   SAB TAB Ectopic Multiple Live Births   2 0 0 0 2     Past Medical History:  Diagnosis Date  . Diabetes mellitus 2005   type 2  . Obesity 2017   Past Surgical History:  Procedure Laterality Date  . DILATION AND CURETTAGE OF UTERUS  2005/2014   Family History: family history includes Diabetes in her mother and sister; Hypertension in her mother and sister. Social History:  reports that she has never smoked. She has never used smokeless tobacco. She reports that she does not drink alcohol or use drugs.     Maternal Diabetes: Yes:  Diabetes Type:  Pre-pregnancy Genetic Screening: Abnormal:  Results: Elevated AFP Maternal Ultrasounds/Referrals: Normal Fetal Ultrasounds or other Referrals:  Fetal echo, Referred to Materal Fetal Medicine  Maternal Substance Abuse:  No Significant Maternal Medications:  None Significant Maternal Lab Results:  None Other Comments:  None  Review of Systems  Constitutional: Negative for chills and fever.  Eyes: Negative for blurred vision and double vision.  Respiratory: Negative for shortness of breath.   Cardiovascular: Negative for chest pain and palpitations.  Gastrointestinal: Negative for abdominal pain, constipation, diarrhea, nausea and vomiting.  Genitourinary: Negative for dysuria,  flank pain, frequency and urgency.  Neurological: Negative for headaches.  Psychiatric/Behavioral: Negative for depression. The patient is nervous/anxious.    Maternal Medical History:  Reason for admission: Nausea.    Dilation: 5 Effacement (%): 90 Station: 0 Exam by:: BEB Blood pressure 117/74, pulse 90, temperature 98.9 F (37.2 C), temperature source Oral, resp. rate 18, last menstrual period 01/20/2015, unknown if currently breastfeeding. Exam Physical Exam  Constitutional: She is oriented to person, place, and time. She appears well-developed and well-nourished. No distress.  Eyes: No scleral icterus.  Neck: Normal range of motion. Neck supple.  Cardiovascular: Normal rate.   Respiratory: Effort normal. No respiratory distress.  GI: Soft. She exhibits no distension. There is no tenderness.  Genitourinary: Vagina normal and uterus normal.  Musculoskeletal: Normal range of motion.  Neurological: She is alert and oriented to person, place, and time.  Skin: Skin is warm and dry.  Psychiatric: She has a normal mood and affect.    Prenatal labs: ABO, Rh: --/--/A POS (08/25 0757) Antibody: NEG (08/25 0757) Rubella: Immune (02/10 0000) RPR: Nonreactive (02/10 0000)  HBsAg: Negative (02/10 0000)  HIV: Non-reactive (06/28 0000)  GBS: Negative (08/09 0000)   Assessment/Plan: Last US 09/21/15- Fetus A-maternal rt, vertex, ant plac, FHR-148 bpm, EFW-2504 g @45 %, mvp AFI-4.93 cm.  Fetus B-maternal lt, vertex, post plac, FHR-164 bpm, [id number]-2513g @44 %, mvp AFI-4.77 cm  ASSESSMENT AND PLAN:  Admit for PROM and induction of labor - Cervix  5/80/0 on admission  Labs pending Epidural when desired Continuous fetal monitoring   1. Fetal Well being  - Fetal Tracing: Cat I strip throughout - Ultrasound:  reviewed, as above - Group B Streptococcus: neg - Presentation: vtx confirmed by u/s   2. Routine OB: - Prenatal labs reviewed, as above - Rh positive  3. Induction of Labor:   -  Contractions IUPC placed -  Plan for induction with pitocin  4. Post Partum Planning: - Infant feeding: breast - Contraception: undecided- has considered BTL     Angelgabriel Willmore 10/07/2015, 10:28 AM

## 2015-10-07 NOTE — Progress Notes (Signed)
Megan Stanton is a 35 y.o. [phone] at 146w1d in active labor after PROM at 6:30am this morning with didi TIUPs, hx of IUFD, T2DM, obesity, abnormal MSAFP with normal anatomy scans. baby B with single umbilical artery.  Subjective: Comfortable with epidural, feeling nauseated. Zofran given. Last blood glucose 113  Objective: BP 100/63   Pulse 79   Temp 98.9 F (37.2 C) (Oral)   Resp 18   LMP 01/20/2015   SpO2 100%  No intake/output data recorded. No intake/output data recorded.  FHT:   Baby A: 145, mod var, no accels, no decels Baby B: 150, mod var, +accels, no decels UC:   irregular, every 2-4 minutes SVE:   Dilation: 9 Effacement (%): 90 Station: +1 Exam by:: BEB  Labs: Lab Results  Component Value Date   WBC 8.3 10/07/2015   HGB 11.6 (L) 10/07/2015   HCT 33.9 (L) 10/07/2015   MCV 83.9 10/07/2015   PLT 114 (L) 10/07/2015    Assessment / Plan: Induction of labor,  progressing well on pitocin. Zofran prn nausea control.  Labor: Progressing normally Preeclampsia:  no evidence of Fetal Wellbeing:  Category I Pain Control:  Epidural I/D:  n/a Anticipated MOD:  NSVD - plan for double set up in the OR with ultrasound at bedside. No fetal weight discordance. Both babies are cephalic. Will plan for prompt and controlled rupture of Baby B's membranes under ultrasonographic visualization.  Megan DouglasBEASLEY, Evonne Stanton 10/07/2015, 1:12 PM

## 2015-10-07 NOTE — Anesthesia Preprocedure Evaluation (Signed)
Anesthesia Evaluation  Patient identified by MRN, date of birth, ID band Patient awake    Reviewed: Allergy & Precautions, H&P , NPO status , Patient's Chart, lab work & pertinent test results  History of Anesthesia Complications Negative for: history of anesthetic complications  Airway Mallampati: II  TM Distance: >3 FB Neck ROM: full    Dental no notable dental hx.    Pulmonary neg pulmonary ROS,    Pulmonary exam normal        Cardiovascular negative cardio ROS Normal cardiovascular exam     Neuro/Psych negative neurological ROS  negative psych ROS   GI/Hepatic Neg liver ROS, GERD  ,  Endo/Other  diabetes  Renal/GU negative Renal ROS  negative genitourinary   Musculoskeletal   Abdominal   Peds  Hematology negative hematology ROS (+)   Anesthesia Other Findings   Reproductive/Obstetrics (+) Pregnancy                             Anesthesia Physical Anesthesia Plan  ASA: III  Anesthesia Plan: Epidural   Post-op Pain Management:    Induction:   Airway Management Planned:   Additional Equipment:   Intra-op Plan:   Post-operative Plan:   Informed Consent: I have reviewed the patients History and Physical, chart, labs and discussed the procedure including the risks, benefits and alternatives for the proposed anesthesia with the patient or authorized representative who has indicated his/her understanding and acceptance.     Plan Discussed with: CRNA and Anesthesiologist  Anesthesia Plan Comments:         Anesthesia Quick Evaluation

## 2015-10-07 NOTE — Progress Notes (Signed)
Rn to the bedside to assist pt. With pericare, pt. alert eating crackers and drinking a coke - per pt. Request. Pain 0/10.  Pt. Transferred to Room #336, infant twins currently in nursery.

## 2015-10-08 LAB — CBC
HEMATOCRIT: 27.9 % — AB (ref 35.0–47.0)
HEMOGLOBIN: 9.3 g/dL — AB (ref 12.0–16.0)
MCH: 28 pg (ref 26.0–34.0)
MCHC: 33.3 g/dL (ref 32.0–36.0)
MCV: 84.1 fL (ref 80.0–100.0)
Platelets: 99 10*3/uL — ABNORMAL LOW (ref 150–440)
RBC: 3.31 MIL/uL — AB (ref 3.80–5.20)
RDW: 14.2 % (ref 11.5–14.5)
WBC: 12.8 10*3/uL — AB (ref 3.6–11.0)

## 2015-10-08 LAB — GLUCOSE, CAPILLARY
GLUCOSE-CAPILLARY: 266 mg/dL — AB (ref 65–99)
GLUCOSE-CAPILLARY: 150 mg/dL — AB (ref 65–99)
GLUCOSE-CAPILLARY: 152 mg/dL — AB (ref 65–99)
Glucose-Capillary: 232 mg/dL — ABNORMAL HIGH (ref 65–99)

## 2015-10-08 LAB — RPR: RPR Ser Ql: NONREACTIVE

## 2015-10-08 NOTE — Progress Notes (Signed)
Post Partum Day 1twin delivery SVD . Infants in nursery for glucose control  Subjective: no complaints  Objective: Blood pressure 122/69, pulse 87, temperature 98.5 F (36.9 C), temperature source Oral, resp. rate 16, last menstrual period 01/20/2015, SpO2 99 %, unknown if currently breastfeeding.  Physical Exam:  General: alert and cooperative Lochia: appropriate Uterine Fundus: firm Incision: n/a DVT Evaluation: No evidence of DVT seen on physical exam.   Recent Labs  10/07/15 0759 10/08/15 0624  HGB 11.6* 9.3*  HCT 33.9* 27.9*    Assessment/Plan: Plan for discharge tomorrow   LOS: 1 day   Viliami Bracco 10/08/2015, 11:23 AM

## 2015-10-08 NOTE — Care Management (Signed)
The patient is recovering and in an excellent mood. The patient shared that she has had 4 kids and lost all of them from 2005-2017. She stated that she was surprised and happy for the birth of her twins. The Chaplain let her know that we will continue to pray for her and them and that if she needs anything that we are here 24/7.

## 2015-10-09 LAB — GLUCOSE, CAPILLARY
GLUCOSE-CAPILLARY: 252 mg/dL — AB (ref 65–99)
GLUCOSE-CAPILLARY: 197 mg/dL — AB (ref 65–99)
Glucose-Capillary: 173 mg/dL — ABNORMAL HIGH (ref 65–99)

## 2015-10-09 MED ORDER — INSULIN DETEMIR 100 UNIT/ML ~~LOC~~ SOLN: 25.0000 [IU] | Freq: Every day | SUBCUTANEOUS | 11 refills | Status: DC

## 2015-10-09 MED ORDER — INSULIN ASPART 100 UNIT/ML ~~LOC~~ SOLN: 0.0000 [IU] | Freq: Three times a day (TID) | SUBCUTANEOUS | 11 refills | Status: DC

## 2015-10-09 NOTE — Progress Notes (Signed)
Discharge instructions given and explained in detail new medication orders. Verbalizes possession of Levemir and novoLOG at home. Understanding of sliding scale and uses one at home.Will call office if any questions or concerns develop. Discharge for postpartum completed. Pt has not been in to see the babies since last night has been encouraged to do so..Marland Kitchen

## 2015-10-09 NOTE — Discharge Summary (Signed)
Obstetric Discharge Summary Reason for Admission: induction of labor Prenatal Procedures: ultrasound Intrapartum Procedures: spontaneous vaginal delivery times 2 Postpartum Procedures: glucose profiling  Complications-Operative and Postpartum: none Hemoglobin  Date Value Ref Range Status  10/08/2015 9.3 (L) 12.0 - 16.0 g/dL Final   HGB  Date Value Ref Range Status  04/10/2012 13.6 12.0 - 16.0 g/dL Final   HCT  Date Value Ref Range Status  10/08/2015 27.9 (L) 35.0 - 47.0 % Final  04/10/2012 42.1 35.0 - 47.0 % Final    Physical Exam:  General: alert and cooperative Lochia: appropriate Uterine Fundus: firm Incision: n/a DVT Evaluation: No evidence of DVT seen on physical exam.  Discharge Diagnoses: twin delivered SVD X2 Discharge Information: Date: 10/09/2015 Activity: pelvic rest Diet: routine and ADA diet  Medications: levamir 25 units q day and novalog tid sliding scale  Condition: stable Instructions: refer to practice specific booklet Discharge to: home Follow-up Information    Christeen DouglasBEASLEY, BETHANY, MD Follow up in 6 week(s).   Specialty:  Obstetrics and Gynecology Contact information: [address] [place] [address] [phone]           Newborn Data:   Karie MainlandDorsey, Boy Ayana [[id number]][[id number]]  Live born female  Birth Weight: 4 lb 15.2 oz (2245 g) APGAR: 8, 9   Smiley HousemanDorsey, BoyB Sia [[id number]][[id number]]  Live born female  Birth Weight: 6 lb 9.8 oz (3000 g) APGAR: 8, 8  Home with in nursery .  Orpha Dain 10/09/2015, 8:54 AM

## 2015-10-10 NOTE — Anesthesia Postprocedure Evaluation (Signed)
Anesthesia Post Note  Patient: Megan Stanton  Procedure(s) Performed: * No procedures listed *  Patient location during evaluation: Mother Baby Anesthesia Type: Epidural Level of consciousness: awake and alert Pain management: pain level controlled Vital Signs Assessment: post-procedure vital signs reviewed and stable Respiratory status: spontaneous breathing, nonlabored ventilation and respiratory function stable Cardiovascular status: stable Postop Assessment: no headache, no backache and epidural receding Anesthetic complications: no    Last Vitals: There were no vitals filed for this visit.  Last Pain: There were no vitals filed for this visit.               Ismail Graziani

## 2015-10-11 LAB — SURGICAL PATHOLOGY

## 2015-10-24 NOTE — Telephone Encounter (Signed)
Telephone note opened in error

## 2015-10-25 ENCOUNTER — Encounter: Payer: Self-pay | Admitting: Obstetrics and Gynecology

## 2015-11-15 ENCOUNTER — Telehealth: Payer: Self-pay

## 2015-11-15 NOTE — Telephone Encounter (Signed)
Pt called asking for the requirements to become established at the clinic again. I explained to her we need utility bill, check stubs, current taxes (2016), and current [place] id. If she cannot get check stubs due to her being out from having a baby she will bring in a letter of support. Pt verbalized understanding.

## 2016-04-22 ENCOUNTER — Emergency Department
Admission: EM | Admit: 2016-04-22 | Discharge: 2016-04-22 | Disposition: A | Discharge status: Home / Self Care | Payer: Medicaid Other | Attending: Emergency Medicine | Admitting: Emergency Medicine

## 2016-04-22 ENCOUNTER — Encounter: Payer: Self-pay | Admitting: Emergency Medicine

## 2016-04-22 DIAGNOSIS — E119 Type 2 diabetes mellitus without complications: Secondary | ICD-10-CM | POA: Insufficient documentation

## 2016-04-22 DIAGNOSIS — K529 Noninfective gastroenteritis and colitis, unspecified: Secondary | ICD-10-CM | POA: Diagnosis not present

## 2016-04-22 DIAGNOSIS — Z794 Long term (current) use of insulin: Secondary | ICD-10-CM | POA: Diagnosis not present

## 2016-04-22 DIAGNOSIS — R112 Nausea with vomiting, unspecified: Secondary | ICD-10-CM | POA: Diagnosis present

## 2016-04-22 MED ORDER — LOPERAMIDE HCL 2 MG PO TABS: 2.0000 mg | ORAL_TABLET | Freq: Four times a day (QID) | ORAL | 0 refills | Status: DC | PRN

## 2016-04-22 MED ORDER — ONDANSETRON 4 MG PO TBDP: 4.0000 mg | ORAL_TABLET | Freq: Three times a day (TID) | ORAL | 0 refills | Status: DC | PRN

## 2016-04-22 NOTE — ED Provider Notes (Signed)
[facility] Emergency Department Provider Note  ____________________________________________  Time seen: Approximately 3:14 PM  I have reviewed the triage vital signs and the nursing notes.   HISTORY  Chief Complaint Emesis; Chills; and Fever    HPI NEHA WAIGHT is a 36 y.o. female who presents emergency department complaining of a one-day history of nausea, emesis, diarrhea. Patient has had subjective fever and some minor chills. Patient states that symptoms began rather rapidly yesterday. She has been maintaining oral hydration with Powerade has not had an appetite. Patient reports that she does have diabetes but her blood sugars are within normal limits. She denies any headache, visual changes, chest pain, shortness of breath, abdominal pain. No hematemesis or hematochezia.   Past Medical History:  Diagnosis Date  . Diabetes mellitus 2005   type 2  . Obesity 2017    Patient Active Problem List   Diagnosis Date Noted  . GDM (gestational diabetes mellitus) 10/03/2015  . Indication for care in labor or delivery 09/16/2015  . Twin pregnancy, twins dichorionic and diamniotic 08/31/2015  . Labor and delivery, indication for care 08/24/2015  . History of stillbirth in currently pregnant patient 06/20/2015  . Single umbilical artery, maternal, antepartum 06/20/2015  . Multigravida of advanced maternal age in first trimester 04/18/2015  . Dichorionic diamniotic twin pregnancy in first trimester 04/18/2015  . Diabetes (HCC) 03/01/2015    Past Surgical History:  Procedure Laterality Date  . CESAREAN SECTION N/A 10/07/2015   Procedure: CESAREAN SECTION;  Surgeon: Christeen Douglas, MD;  Location: [facility] ORS;  Service: Obstetrics;  Laterality: N/A;  . DILATION AND CURETTAGE OF UTERUS  2005/2014    Prior to Admission medications   Medication Sig Start Date End Date Taking? Authorizing Provider  folic acid (FOLVITE) 1 MG tablet Take 1 mg by mouth daily.     Historical Provider, MD  insulin aspart (NOVOLOG) 100 UNIT/ML injection Inject 0-20 Units into the skin 3 (three) times daily with meals. 10/09/15   Ihor Austin Schermerhorn, MD  insulin detemir (LEVEMIR) 100 UNIT/ML injection Inject 0.25 mLs (25 Units total) into the skin daily with breakfast. 10/09/15   Suzy Bouchard, MD  loperamide (IMODIUM A-D) 2 MG tablet Take 1 tablet (2 mg total) by mouth 4 (four) times daily as needed for diarrhea or loose stools. 04/22/16   Delorise Royals Josiyah Tozzi, PA-C  ondansetron (ZOFRAN-ODT) 4 MG disintegrating tablet Take 1 tablet (4 mg total) by mouth every 8 (eight) hours as needed for nausea or vomiting. 04/22/16   Delorise Royals Nakina Spatz, PA-C  Prenatal Vit-Fe Fumarate-FA (PRENATAL MULTIVITAMIN) TABS tablet Take 1 tablet by mouth daily at 12 noon.    Historical Provider, MD    Allergies Penicillins  Family History  Problem Relation Age of Onset  . Hypertension Mother   . Diabetes Mother     Type 2  . Diabetes Sister   . Hypertension Sister     Social History Social History  Substance Use Topics  . Smoking status: Never Smoker  . Smokeless tobacco: Never Used  . Alcohol use No     Review of Systems  Constitutional: No fever/chills ENT: No upper respiratory complaints. Cardiovascular: no chest pain. Respiratory: no cough. No SOB. Gastrointestinal: No abdominal pain.  Positive for nausea and vomiting.Marland Kitchen  Positive diarrhea.  No constipation. Genitourinary: Negative for dysuria. No hematuria Musculoskeletal: Negative for musculoskeletal pain. Skin: Negative for rash, abrasions, lacerations, ecchymosis. Neurological: Negative for headaches, focal weakness or numbness. 10-point ROS otherwise negative.  ____________________________________________  PHYSICAL EXAM:  VITAL SIGNS: ED Triage Vitals [04/22/16 1414]  Enc Vitals Group     BP 115/68     Pulse Rate 88     Resp 18     Temp 99.3 F (37.4 C)     Temp Source Oral     SpO2 96 %      Weight 260 lb (117.9 kg)     Height 5\' 9"  (1.753 m)     Head Circumference      Peak Flow      Pain Score      Pain Loc      Pain Edu?      Excl. in GC?      Constitutional: Alert and oriented. Well appearing and in no acute distress. Eyes: Conjunctivae are normal. PERRL. EOMI. Head: Atraumatic. ENT:      Ears:       Nose: No congestion/rhinnorhea.      Mouth/Throat: Mucous membranes are moist.  Neck: No stridor.    Cardiovascular: Normal rate, regular rhythm. Normal S1 and S2.  Good peripheral circulation. Respiratory: Normal respiratory effort without tachypnea or retractions. Lungs CTAB. Good air entry to the bases with no decreased or absent breath sounds. Gastrointestinal: Bowel sounds 4 quadrants. Soft and nontender to palpation. No guarding or rigidity. No palpable masses. No distention. No CVA tenderness. Musculoskeletal: Full range of motion to all extremities. No gross deformities appreciated. Neurologic:  Normal speech and language. No gross focal neurologic deficits are appreciated.  Skin:  Skin is warm, dry and intact. No rash noted. Psychiatric: Mood and affect are normal. Speech and behavior are normal. Patient exhibits appropriate insight and judgement.   ____________________________________________   LABS (all labs ordered are listed, but only abnormal results are displayed)  Labs Reviewed - No data to display ____________________________________________  EKG   ____________________________________________  RADIOLOGY   No results found.  ____________________________________________    PROCEDURES  Procedure(s) performed:    Procedures    Medications - No data to display   ____________________________________________   INITIAL IMPRESSION / ASSESSMENT AND PLAN / ED COURSE  Pertinent labs & imaging results that were available during my care of the patient were reviewed by me and considered in my medical decision making (see chart for  details).  Review of the [place] CSRS was performed in accordance of the [facility] prior to dispensing any controlled drugs.     Patient's diagnosis is consistent with prior gastroenteritis. Patient presents with symptoms consistent with viral gastroenteritis. Patient has no concerning symptoms necessitating labs or imaging at this time. Patient does report improvement over the previous 24 hours. Patient will be prescribed Zofran and loperamide. Patient will follow-up with primary care as needed.. Patient is given ED precautions to return to the ED for any worsening or new symptoms.     ____________________________________________  FINAL CLINICAL IMPRESSION(S) / ED DIAGNOSES  Final diagnoses:  Gastroenteritis      NEW MEDICATIONS STARTED DURING THIS VISIT:  Discharge Medication List as of 04/22/2016  3:24 PM    START taking these medications   Details  loperamide (IMODIUM A-D) 2 MG tablet Take 1 tablet (2 mg total) by mouth 4 (four) times daily as needed for diarrhea or loose stools., Starting Sun 04/22/2016, Print    ondansetron (ZOFRAN-ODT) 4 MG disintegrating tablet Take 1 tablet (4 mg total) by mouth every 8 (eight) hours as needed for nausea or vomiting., Starting Sun 04/22/2016, Print  This chart was dictated using voice recognition software/Dragon. Despite best efforts to proofread, errors can occur which can change the meaning. Any change was purely unintentional.    Racheal PatchesJonathan D Mackenzy Eisenberg, PA-C 04/22/16 1535    Emily FilbertJonathan E Williams, MD 04/22/16 1555

## 2016-04-22 NOTE — ED Triage Notes (Signed)
Pt presents to ED with c/o vomiting, chills, and fever after eating yesterday. Pt states she couldn't keep anything down yesterday, and the only thing she could keep down today was powerade. Pt is alert and oriented at this time. Respirations even and unlabored, skin is warm, dry, and intact.

## 2016-06-15 ENCOUNTER — Emergency Department
Admission: EM | Admit: 2016-06-15 | Discharge: 2016-06-15 | Disposition: A | Discharge status: Home / Self Care | Payer: Medicaid Other | Attending: Emergency Medicine | Admitting: Emergency Medicine

## 2016-06-15 ENCOUNTER — Encounter: Payer: Self-pay | Admitting: Emergency Medicine

## 2016-06-15 DIAGNOSIS — E119 Type 2 diabetes mellitus without complications: Secondary | ICD-10-CM | POA: Insufficient documentation

## 2016-06-15 DIAGNOSIS — Z794 Long term (current) use of insulin: Secondary | ICD-10-CM | POA: Diagnosis not present

## 2016-06-15 DIAGNOSIS — K0889 Other specified disorders of teeth and supporting structures: Secondary | ICD-10-CM

## 2016-06-15 MED ORDER — KETOROLAC TROMETHAMINE 60 MG/2ML IM SOLN
30.0000 mg | Freq: Once | INTRAMUSCULAR | Status: AC
  Administered 2016-06-15: 30 mg via INTRAMUSCULAR
  Filled 2016-06-15: qty 2

## 2016-06-15 MED ORDER — IBUPROFEN 800 MG PO TABS: 800.0000 mg | ORAL_TABLET | Freq: Three times a day (TID) | ORAL | 0 refills | Status: AC | PRN

## 2016-06-15 NOTE — Discharge Instructions (Signed)
Take medication as prescribed. Return to emergency department if symptoms worsen and follow-up with PCP as needed.     OPTIONS FOR DENTAL FOLLOW UP CARE  [place] Department of Health and Human Services - Local Safety Net Dental Clinics [email or web address]://www.ncdhhs.gov/dph/oralhealth/services/safetynetclinics.htm   [facility] [phone])  [facility] [phone])  [facility] [phone]  [facility] [phone])  [facility] [phone]) This clinic caters to the indigent population and is on a lottery system. Location: [employer] [address] [address], [address], [place] [facility] Hours: Wednesdays from 6pm - 9pm, patients seen by a lottery system. For dates, call or go to [email or web address] Services: Cleanings, fillings and simple extractions. Payment Options: DENTAL WORK IS FREE OF CHARGE. Bring proof of income or support. Best way to get seen: Arrive at 5:15 pm - this is a lottery, NOT first come/first serve, so arriving earlier will not increase your chances of being seen.     [facility] [phone] Select option 1 for emergencies   Location: [facility] [address], [address], [place] [facility] Hours: No walk-ins accepted - call the day before to schedule an appointment. Check in times are 9:30 am and 1:30 pm. Services: Simple extractions, temporary fillings, pulpectomy/pulp debridement, uncomplicated abscess drainage. Payment Options: PAYMENT IS DUE AT THE TIME OF SERVICE.  Fee is usually $100-200, additional surgical procedures (e.g. abscess drainage) may be extra. Cash, checks, Visa/MasterCard accepted.  Can file Medicaid if patient is covered for dental - patient should call case worker to check. No discount for [facility] patients. Best way to get seen: MUST call the day before and get onto the schedule.  Can usually be seen the next 1-2 days. No walk-ins accepted.     [facility] [phone]   Location: [facility], [address], [place] Clinic Hours: M, W, Th, F 8am or 1:30pm, Tues 9a or 1:30 - first come/first served. Services: Simple extractions, temporary fillings, uncomplicated abscess drainage.  You do not need to be an [place] resident. Payment Options: PAYMENT IS DUE AT THE TIME OF SERVICE. Dental insurance, otherwise sliding scale - bring proof of income or support. Depending on income and treatment needed, cost is usually $50-200. Best way to get seen: Arrive early as it is first come/first served.     [facility] [phone]   Location: [address] Clinic Hours: Mon-Thu 8a-5p Services: Most basic dental services including extractions and fillings. Payment Options: PAYMENT IS DUE AT THE TIME OF SERVICE. Sliding scale, up to 50% off - bring proof if income or support. Medicaid with dental option accepted. Best way to get seen: Call to schedule an appointment, can usually be seen within 2 weeks OR they will try to see walk-ins - show up at 8a or 2p (you may have to wait).     [facility] [phone] [place] RESIDENTS ONLY   Location: [facility], [address], [place], [place] [address] Clinic Hours: By appointment only. Monday - Thursday 8am-5pm, Friday 8am-12pm Services: Cleanings, fillings, extractions. Payment Options: PAYMENT IS DUE AT THE TIME OF SERVICE. Cash, Visa or MasterCard. Sliding scale - $30 minimum per service. Best way to get seen: Come in to office, complete packet and make an appointment - need proof of income or support monies for each household member and proof of [place] residence. Usually takes about  takes about a month to get in. °  °  °[facility] °[phone] °   °Location: °[address] [place] °Clinic Hours: Walk-in Urgent Care Dental Services are offered Monday-Friday mornings only. °The numbers of emergencies accepted daily is limited to the number of °providers available. °Maximum 15 - Mondays, Wednesdays & Thursdays °Maximum 10 - Tuesdays & Fridays °Services: °You do not need to be a [place] resident to be seen for a dental emergency. °Emergencies are defined as pain, swelling, abnormal bleeding, or dental trauma. Walkins will receive x-rays if needed. °NOTE: Dental cleaning is not an emergency. °Payment Options: °PAYMENT IS DUE AT THE TIME OF SERVICE. °Minimum co-pay is $40.00 for uninsured patients. °Minimum co-pay is $3.00 for Medicaid with dental coverage. °Dental Insurance is accepted and must be presented at time of visit. °Medicare does not cover dental. °Forms of payment: Cash, credit card, checks. °Best way to get seen: °If not previously registered with the clinic, walk-in dental registration begins at 7:15 am and is on a first come/first serve basis. °If previously registered with the clinic, call to make an appointment. °  °  °[facility] °[phone] °[place] RESIDENTS ONLY °  °Location: °[address], [place], [place] °Clinic Hours: °Mon-Thu 10a-2p °Services: Extractions only! °Payment Options: °FREE (donations accepted) - bring proof of income or support °Best way to get seen: °Call and schedule an appointment OR come at 8am on the 1st Monday of every month (except for holidays) when it is first come/first served. °  °  °[facility] °[phone] °  °Location: °[address], [place] °Clinic Hours: °Friday mornings °Services, Payment Options, Best way to get seen: °Call for info  °

## 2016-06-15 NOTE — ED Triage Notes (Signed)
Pt comes into the ED via POV c/o right upper jaw/dental pain.  Patient in NAD at this time with even and unlabored respirations.

## 2016-06-16 NOTE — ED Provider Notes (Signed)
[facility] Emergency Department Provider Note   ____________________________________________   I have reviewed the triage vital signs and the nursing notes.   HISTORY  Chief Complaint Dental Pain    HPI Megan Stanton is a 36 y.o. female presents with right lower jaw and tooth pain x 1 day. She denies any association of the start of pain with food or trauma. Pt denies fever/chills, headache, or N/V. She does not note any facial swelling. Patient denies any past history of dental pain along this area of her mouth.    Past Medical History:  Diagnosis Date  . Diabetes mellitus 2005   type 2  . Obesity 2017    Patient Active Problem List   Diagnosis Date Noted  . GDM (gestational diabetes mellitus) 10/03/2015  . Indication for care in labor or delivery 09/16/2015  . Twin pregnancy, twins dichorionic and diamniotic 08/31/2015  . Labor and delivery, indication for care 08/24/2015  . History of stillbirth in currently pregnant patient 06/20/2015  . Single umbilical artery, maternal, antepartum 06/20/2015  . Multigravida of advanced maternal age in first trimester 04/18/2015  . Dichorionic diamniotic twin pregnancy in first trimester 04/18/2015  . Diabetes (HCC) 03/01/2015    Past Surgical History:  Procedure Laterality Date  . CESAREAN SECTION N/A 10/07/2015   Procedure: CESAREAN SECTION;  Surgeon: Christeen DouglasBethany Beasley, MD;  Location: [facility] ORS;  Service: Obstetrics;  Laterality: N/A;  . DILATION AND CURETTAGE OF UTERUS  2005/2014    Prior to Admission medications   Medication Sig Start Date End Date Taking? Authorizing Provider  folic acid (FOLVITE) 1 MG tablet Take 1 mg by mouth daily.    Historical Provider, MD  ibuprofen (ADVIL,MOTRIN) 800 MG tablet Take 1 tablet (800 mg total) by mouth every 8 (eight) hours as needed for mild pain. 06/15/16 06/25/16  Kortlynn Poust M Jyair Kiraly, PA-C  insulin aspart (NOVOLOG) 100 UNIT/ML injection Inject 0-20 Units into the skin  3 (three) times daily with meals. 10/09/15   Ihor Austinhomas J Schermerhorn, MD  insulin detemir (LEVEMIR) 100 UNIT/ML injection Inject 0.25 mLs (25 Units total) into the skin daily with breakfast. 10/09/15   Suzy Bouchardhomas J Schermerhorn, MD  loperamide (IMODIUM A-D) 2 MG tablet Take 1 tablet (2 mg total) by mouth 4 (four) times daily as needed for diarrhea or loose stools. 04/22/16   Delorise RoyalsJonathan D Cuthriell, PA-C  ondansetron (ZOFRAN-ODT) 4 MG disintegrating tablet Take 1 tablet (4 mg total) by mouth every 8 (eight) hours as needed for nausea or vomiting. 04/22/16   Delorise RoyalsJonathan D Cuthriell, PA-C  Prenatal Vit-Fe Fumarate-FA (PRENATAL MULTIVITAMIN) TABS tablet Take 1 tablet by mouth daily at 12 noon.    Historical Provider, MD    Allergies Penicillins  Family History  Problem Relation Age of Onset  . Hypertension Mother   . Diabetes Mother     Type 2  . Diabetes Sister   . Hypertension Sister     Social History Social History  Substance Use Topics  . Smoking status: Never Smoker  . Smokeless tobacco: Never Used  . Alcohol use No    Review of Systems Constitutional: No fever/chills Eyes: No visual changes. ENT: No sore throat. Right lower jaw/tooth pain Cardiovascular: Denies chest pain. Respiratory: Denies cough Gastrointestinal: No abdominal pain.  No nausea, no vomiting.   Genitourinary: Negative for dysuria. Musculoskeletal: Negative for back pain. Skin: Negative for rash. Neurological: Negative for headaches  ____________________________________________   PHYSICAL EXAM:  VITAL SIGNS: ED Triage Vitals  Enc Vitals  Group     BP 06/15/16 1911 123/78     Pulse Rate 06/15/16 1911 86     Resp 06/15/16 1911 18     Temp 06/15/16 1911 98.4 F (36.9 C)     Temp Source 06/15/16 1911 Oral     SpO2 06/15/16 1911 100 %     Weight 06/15/16 1856 260 lb (117.9 kg)     Height 06/15/16 1856 5\' 8"  (1.727 m)     Head Circumference --      Peak Flow --      Pain Score 06/15/16 1822 7     Pain Loc --        Pain Edu? --      Excl. in GC? --     Constitutional: Alert and oriented. Well appearing and in no acute distress. Eyes: Conjunctivae are normal. Head: Atraumatic. Nose: No congestion/rhinnorhea. Mouth/Throat: Mucous membranes are moist.  No redness or infection noted right lower gumline. No obvious dental caries. Multiple fillings in molars noted.  Cardiovascular: Normal rate, regular rhythm.  Good peripheral circulation. Respiratory: Normal respiratory effort. No SOB Genitourinary: deferred Musculoskeletal: No lower extremity tenderness nor edema.  No joint effusions. Neurologic:  Normal speech and language. No gross focal neurologic deficits are appreciated.  Skin:  Skin is warm, dry and intact. No rash noted. Psychiatric: Mood and affect are normal. Speech and behavior are normal.  ____________________________________________   LABS (all labs ordered are listed, but only abnormal results are displayed)  Labs Reviewed - No data to display ____________________________________________  EKG none  ____________________________________________  RADIOLOGY none ____________________________________________   PROCEDURES  Procedure(s) performed: no    Critical Care performed: no ____________________________________________   INITIAL IMPRESSION / ASSESSMENT AND PLAN / ED COURSE  Pertinent labs & imaging results that were available during my care of the patient were reviewed by me and considered in my medical decision making (see chart for details).  Patient presented with generalized dental pain along the right lower jaw. Physical exam was reassuring for no presence of infection or abscess. Pt given antiinflammatory during ED course and she noted reduction of pain symptoms. Pt will continue pain management with NSAIDS and follow up with her dentists as soon as she can schedule an appointment. She was encourage to schedule within 1 week. Patient informed to return to ED  if symptoms worsened for attending her dental appointment.       ____________________________________________   FINAL CLINICAL IMPRESSION(S) / ED DIAGNOSES  Final diagnoses:  Pain, dental      NEW MEDICATIONS STARTED DURING THIS VISIT:  Discharge Medication List as of 06/15/2016  7:40 PM    START taking these medications   Details  ibuprofen (ADVIL,MOTRIN) 800 MG tablet Take 1 tablet (800 mg total) by mouth every 8 (eight) hours as needed for mild pain., Starting Fri 06/15/2016, Until Mon 06/25/2016, Print         Note:  This document was prepared using Dragon voice recognition software and may include unintentional dictation errors.   Jordan Likes Ogechi Kuehnel, PA-C 06/16/16 1610    Sharyn Creamer, MD 06/21/16 [phone]

## 2017-10-24 ENCOUNTER — Ambulatory Visit: Payer: Self-pay

## 2017-11-07 ENCOUNTER — Ambulatory Visit: Payer: Self-pay

## 2017-11-14 ENCOUNTER — Ambulatory Visit: Payer: Medicaid Other | Admitting: Family Medicine

## 2017-11-14 VITALS — BP 106/71 | HR 90 | Temp 98.1°F | Ht 66.5 in | Wt 268.2 lb

## 2017-11-14 DIAGNOSIS — Z0000 Encounter for general adult medical examination without abnormal findings: Secondary | ICD-10-CM

## 2017-11-14 DIAGNOSIS — Z794 Long term (current) use of insulin: Secondary | ICD-10-CM

## 2017-11-14 DIAGNOSIS — G5601 Carpal tunnel syndrome, right upper limb: Secondary | ICD-10-CM

## 2017-11-14 DIAGNOSIS — Z09 Encounter for follow-up examination after completed treatment for conditions other than malignant neoplasm: Secondary | ICD-10-CM

## 2017-11-14 DIAGNOSIS — E119 Type 2 diabetes mellitus without complications: Secondary | ICD-10-CM

## 2017-11-14 MED ORDER — INSULIN DETEMIR 100 UNIT/ML FLEXPEN: 65.0000 [IU] | PEN_INJECTOR | Freq: Every day | SUBCUTANEOUS | 11 refills | Status: DC

## 2017-11-14 MED ORDER — IBUPROFEN 800 MG PO TABS: 800.0000 mg | ORAL_TABLET | Freq: Three times a day (TID) | ORAL | 0 refills | Status: DC | PRN

## 2017-11-14 NOTE — Progress Notes (Signed)
New Patient--Establish   Patient: Megan Stanton Female    DOB: Nov 23, 1980   37 y.o.   MRN: [id number] Visit Date: 11/16/2017  Today's Provider: Kallie Locks, FNP   Chief Complaint  Patient presents with  . New Patient (Initial Visit)    Routine diabetes f/u   Subjective:    HPI  [name] is a 37 year old female with a past medical history of Obesity and Diabetes. She is here today to establish care.   Current Status: Since her last office visit, she is doing well with no complaints.   She states that her blood glucose levels are usually 200-230. She is currently taking Levemir 65 units before breakfast and Novolog 5 units 3 times daily before meals.   She has intermittent pain in her right thumb. She reports that she experiences pain on a daily basis. She has not had any treatment for her pain and discomfort.   She denies fevers, chills, fatigue, recent infections, weight loss, and night sweats. She has not had any headaches, visual changes, dizziness, and falls. No chest pain, heart palpitations, cough and shortness of breath reported. No reports of GI problems such as nausea, vomiting, diarrhea, and constipation. She has no reports of blood in stools, dysuria and hematuria. No depression or anxiety reported.  Allergies  Allergen Reactions  . Penicillins Hives   Previous Medications   FOLIC ACID (FOLVITE) 1 MG TABLET    Take 1 mg by mouth daily.   INSULIN ASPART (NOVOLOG) 100 UNIT/ML INJECTION    Inject 0-20 Units into the skin 3 (three) times daily with meals.    Past Medical History:  Diagnosis Date  . Diabetes mellitus 2005   type 2  . Obesity 2017    Family History  Problem Relation Age of Onset  . Hypertension Mother   . Diabetes Mother        Type 2  . Diabetes Sister   . Hypertension Sister     Social History   Socioeconomic History  . Marital status: Legally Separated    Spouse name: Not on file  . Number of children: 2  . Years of education:  Not on file  . Highest education level: Some college, no degree  Occupational History  . Not on file  Social Needs  . Financial resource strain: Somewhat hard  . Food insecurity:    Worry: Never true    Inability: Never true  . Transportation needs:    Medical: No    Non-medical: No  Tobacco Use  . Smoking status: Never Smoker  . Smokeless tobacco: Never Used  Substance and Sexual Activity  . Alcohol use: No  . Drug use: No  . Sexual activity: Yes    Birth control/protection: Condom  Lifestyle  . Physical activity:    Days per week: Not on file    Minutes per session: Not on file  . Stress: Not on file  Relationships  . Social connections:    Talks on phone: More than three times a week    Gets together: Three times a week    Attends religious service: More than 4 times per year    Active member of club or organization: No    Attends meetings of clubs or organizations: Never    Relationship status: Separated  . Intimate partner violence:    Fear of current or ex partner: No    Emotionally abused: No    Physically abused: No  Forced sexual activity: No  Other Topics Concern  . 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     Past Surgical History:  Procedure Laterality Date  . CESAREAN SECTION N/A 10/07/2015   Procedure: CESAREAN SECTION;  Surgeon: Christeen Douglas, MD;  Location: [facility] ORS;  Service: Obstetrics;  Laterality: N/A;  . DILATION AND CURETTAGE OF UTERUS  2005/2014    Current Meds  Medication Sig  . insulin aspart (NOVOLOG) 100 UNIT/ML injection Inject 0-20 Units into the skin 3 (three) times daily with meals.  . [DISCONTINUED] insulin detemir (LEVEMIR) 100 UNIT/ML injection Inject 0.25 mLs (25 Units total) into the skin daily with breakfast.    Immunization History  Administered Date(s) Administered  . Influenza-Unspecified 12/02/2012    Allergies  Allergen Reactions  . Penicillins Hives    Review of Systems  Constitutional: Negative.   Eyes: Negative.   Respiratory: Negative.   Cardiovascular: Negative.   Gastrointestinal: Negative.   Genitourinary: Negative.   Musculoskeletal:       Pain in thumb  Skin: Negative.   Neurological: Negative.   Psychiatric/Behavioral: Negative.     Social History   Tobacco Use  . Smoking status: Never Smoker  . Smokeless tobacco: Never Used  Substance Use Topics  . Alcohol use: No   Objective:   BP 106/71 (BP Location: Left Arm, Patient Position: Sitting, Cuff Size: Large)   Pulse 90   Temp 98.1 F (36.7 C) (Oral)   Ht 5' 6.5" (1.689 m)   Wt 268 lb 3.2 oz (121.7 kg)   BMI 42.64 kg/m   Physical Exam  Constitutional: She is oriented to person, place, and time. She appears well-developed and well-nourished.  Cardiovascular: Normal rate, regular rhythm, normal heart sounds and intact distal pulses.  Pulmonary/Chest: Effort normal and breath sounds normal.  Abdominal: Soft. Bowel sounds are normal.  Musculoskeletal: Normal range of motion.  Neurological: She is alert and oriented to person, place, and time.  Psychiatric: She has a normal mood and affect. Her behavior is normal. Judgment and thought content normal.  Nursing note and vitals reviewed.  Assessment & Plan:   1. Healthcare maintenance - CBC w/Diff - Comprehensive metabolic panel - TSH - Lipid Profile - HgB A1c - Urinalysis, Routine w reflex microscopic - POCT Glucose (CBG)  2. Type 2 diabetes mellitus without complication, with long-term current use of insulin (HCC) We will evaluate Hgb A1c today. Result pending.  - Insulin Detemir (LEVEMIR) 100 UNIT/ML Pen; Inject 65 Units into the skin daily.  Dispense: 15 mL; Refill: 11  3. Carpal tunnel syndrome of right wrist Initiate Motrin for intermittent pain. If she continues to have increased pain, possible scan in the future.  - ibuprofen (ADVIL,MOTRIN) 800 MG tablet; Take 1 tablet (800 mg total) by mouth  every 8 (eight) hours as needed.  Dispense: 30 tablet; Refill: 0  4. Follow up She will follow up in 2 weeks.   Meds ordered this encounter  Medications  . Insulin Detemir (LEVEMIR) 100 UNIT/ML Pen    Sig: Inject 65 Units into the skin daily.    Dispense:  15 mL    Refill:  11  . ibuprofen (ADVIL,MOTRIN) 800 MG tablet    Sig: Take 1 tablet (800 mg total) by mouth every 8 (eight) hours as needed.    Dispense:  30 tablet    Refill:  0   Raliegh Ip,  MSN, FNP-C [facility]  [facility] [address] [place], [place] [address] [phone]   [facility]

## 2017-11-14 NOTE — Patient Instructions (Addendum)
Heart-Healthy Eating Plan Heart-healthy meal planning includes:  Limiting unhealthy fats.  Increasing healthy fats.  Making other small dietary changes.  You may need to talk with your doctor or a diet specialist (dietitian) to create an eating plan that is right for you. What types of fat should I choose?  Choose healthy fats. These include olive oil and canola oil, flaxseeds, walnuts, almonds, and seeds.  Eat more omega-3 fats. These include salmon, mackerel, sardines, tuna, flaxseed oil, and ground flaxseeds. Try to eat fish at least twice each week.  Limit saturated fats. ? Saturated fats are often found in animal products, such as meats, butter, and cream. ? Plant sources of saturated fats include palm oil, palm kernel oil, and coconut oil.  Avoid foods with partially hydrogenated oils in them. These include stick margarine, some tub margarines, cookies, crackers, and other baked goods. These contain trans fats. What general guidelines do I need to follow?  Check food labels carefully. Identify foods with trans fats or high amounts of saturated fat.  Fill one half of your plate with vegetables and green salads. Eat 4-5 servings of vegetables per day. A serving of vegetables is: ? 1 cup of raw leafy vegetables. ?  cup of raw or cooked cut-up vegetables. ?  cup of vegetable juice.  Fill one fourth of your plate with whole grains. Look for the word "whole" as the first word in the ingredient list.  Fill one fourth of your plate with lean protein foods.  Eat 4-5 servings of fruit per day. A serving of fruit is: ? One medium whole fruit. ?  cup of dried fruit. ?  cup of fresh, frozen, or canned fruit. ?  cup of 100% fruit juice.  Eat more foods that contain soluble fiber. These include apples, broccoli, carrots, beans, peas, and barley. Try to get 20-30 g of fiber per day.  Eat more home-cooked food. Eat less restaurant, buffet, and fast food.  Limit or avoid  alcohol.  Limit foods high in starch and sugar.  Avoid fried foods.  Avoid frying your food. Try baking, boiling, grilling, or broiling it instead. You can also reduce fat by: ? Removing the skin from poultry. ? Removing all visible fats from meats. ? Skimming the fat off of stews, soups, and gravies before serving them. ? Steaming vegetables in water or broth.  Lose weight if you are overweight.  Eat 4-5 servings of nuts, legumes, and seeds per week: ? One serving of dried beans or legumes equals  cup after being cooked. ? One serving of nuts equals 1 ounces. ? One serving of seeds equals  ounce or one tablespoon.  You may need to keep track of how much salt or sodium you eat. This is especially true if you have high blood pressure. Talk with your doctor or dietitian to get more information. What foods can I eat? Grains Breads, including [place], white, pita, wheat, raisin, rye, oatmeal, and [place]. Tortillas that are neither fried nor made with lard or trans fat. Low-fat rolls, including hotdog and hamburger buns and English muffins. Biscuits. Muffins. Waffles. Pancakes. Light popcorn. Whole-grain cereals. Flatbread. Melba toast. Pretzels. Breadsticks. Rusks. Low-fat snacks. Low-fat crackers, including oyster, saltine, matzo, graham, animal, and rye. Rice and pasta, including brown rice and pastas that are made with whole wheat. Vegetables All vegetables. Fruits All fruits, but limit coconut. Meats and Other Protein Sources Lean, well-trimmed beef, veal, pork, and lamb. Chicken and [place] without skin. All  fish and shellfish. Wild duck, rabbit, pheasant, and venison. Egg whites or low-cholesterol egg substitutes. Dried beans, peas, lentils, and tofu. Seeds and most nuts. Dairy Low-fat or nonfat cheeses, including ricotta, string, and mozzarella. Skim or 1% milk that is liquid, powdered, or evaporated. Buttermilk that is made with low-fat milk. Nonfat or low-fat  yogurt. Beverages Mineral water. Diet carbonated beverages. Sweets and Desserts Sherbets and fruit ices. Honey, jam, marmalade, jelly, and syrups. Meringues and gelatins. Pure sugar candy, such as hard candy, jelly beans, gumdrops, mints, marshmallows, and small amounts of dark chocolate. [employer]. Eat all sweets and desserts in moderation. Fats and Oils Nonhydrogenated (trans-free) margarines. Vegetable oils, including soybean, sesame, sunflower, olive, peanut, safflower, corn, canola, and cottonseed. Salad dressings or mayonnaise made with a vegetable oil. Limit added fats and oils that you use for cooking, baking, salads, and as spreads. Other Cocoa powder. Coffee and tea. All seasonings and condiments. The items listed above may not be a complete list of recommended foods or beverages. Contact your dietitian for more options. What foods are not recommended? Grains Breads that are made with saturated or trans fats, oils, or whole milk. Croissants. Butter rolls. Cheese breads. Sweet rolls. Donuts. Buttered popcorn. Chow mein noodles. High-fat crackers, such as cheese or butter crackers. Meats and Other Protein Sources Fatty meats, such as hotdogs, short ribs, sausage, spareribs, bacon, rib eye roast or steak, and mutton. High-fat deli meats, such as salami and bologna. Caviar. Domestic duck and goose. Organ meats, such as kidney, liver, sweetbreads, and heart. Dairy Cream, sour cream, cream cheese, and creamed cottage cheese. Whole-milk cheeses, including blue (bleu), [address], [place], [place], [address], [place], [address], cheddar, [place], and [place]. Whole or 2% milk that is liquid, evaporated, or condensed. Whole buttermilk. Cream sauce or high-fat cheese sauce. Yogurt that is made from whole milk. Beverages Regular sodas and juice drinks with added sugar. Sweets and Desserts Frosting. Pudding. Cookies. Cakes other than angel food cake. Candy that has milk chocolate or white  chocolate, hydrogenated fat, butter, coconut, or unknown ingredients. Buttered syrups. Full-fat ice cream or ice cream drinks. Fats and Oils Gravy that has suet, meat fat, or shortening. Cocoa butter, hydrogenated oils, palm oil, coconut oil, palm kernel oil. These can often be found in baked products, candy, fried foods, nondairy creamers, and whipped toppings. Solid fats and shortenings, including bacon fat, salt pork, lard, and butter. Nondairy cream substitutes, such as coffee creamers and sour cream substitutes. Salad dressings that are made of unknown oils, cheese, or sour cream. The items listed above may not be a complete list of foods and beverages to avoid. Contact your dietitian for more information. This information is not intended to replace advice given to you by your health care provider. Make sure you discuss any questions you have with your health care provider. Document Released: 07/31/2011 Document Revised: 07/07/2015 Document Reviewed: 07/23/2013 Elsevier Interactive Patient Education  2018 [employer]. DASH Eating Plan  DASH stands for "Dietary Approaches to Stop Hypertension." The DASH eating plan is a healthy eating plan that has been shown to reduce high blood pressure (hypertension). It may also reduce your risk for type 2 diabetes, heart disease, and stroke. The DASH eating plan may also help with weight loss. What are tips for following this plan? General guidelines  Avoid eating more than 2,300 mg (milligrams) of salt (sodium) a day. If you have hypertension, you may need to reduce your sodium intake to 1,500 mg a day.  Limit alcohol  intake to no more than 1 drink a day for nonpregnant women and 2 drinks a day for men. One drink equals 12 oz of beer, 5 oz of wine, or 1 oz of hard liquor.  Work with your health care provider to maintain a healthy body weight or to lose weight. Ask what an ideal weight is for you.  Get at least 30 minutes of exercise that causes your  heart to beat faster (aerobic exercise) most days of the week. Activities may include walking, swimming, or biking.  Work with your health care provider or diet and nutrition specialist (dietitian) to adjust your eating plan to your individual calorie needs. Reading food labels  Check food labels for the amount of sodium per serving. Choose foods with less than 5 percent of the Daily Value of sodium. Generally, foods with less than 300 mg of sodium per serving fit into this eating plan.  To find whole grains, look for the word "whole" as the first word in the ingredient list. Shopping  Buy products labeled as "low-sodium" or "no salt added."  Buy fresh foods. Avoid canned foods and premade or frozen meals. Cooking  Avoid adding salt when cooking. Use salt-free seasonings or herbs instead of table salt or sea salt. Check with your health care provider or pharmacist before using salt substitutes.  Do not fry foods. Cook foods using healthy methods such as baking, boiling, grilling, and broiling instead.  Cook with heart-healthy oils, such as olive, canola, soybean, or sunflower oil. Meal planning   Eat a balanced diet that includes: ? 5 or more servings of fruits and vegetables each day. At each meal, try to fill half of your plate with fruits and vegetables. ? Up to 6-8 servings of whole grains each day. ? Less than 6 oz of lean meat, poultry, or fish each day. A 3-oz serving of meat is about the same size as a deck of cards. One egg equals 1 oz. ? 2 servings of low-fat dairy each day. ? A serving of nuts, seeds, or beans 5 times each week. ? Heart-healthy fats. Healthy fats called Omega-3 fatty acids are found in foods such as flaxseeds and coldwater fish, like sardines, salmon, and mackerel.  Limit how much you eat of the following: ? Canned or prepackaged foods. ? Food that is high in trans fat, such as fried foods. ? Food that is high in saturated fat, such as fatty  meat. ? Sweets, desserts, sugary drinks, and other foods with added sugar. ? Full-fat dairy products.  Do not salt foods before eating.  Try to eat at least 2 vegetarian meals each week.  Eat more home-cooked food and less restaurant, buffet, and fast food.  When eating at a restaurant, ask that your food be prepared with less salt or no salt, if possible. What foods are recommended? The items listed may not be a complete list. Talk with your dietitian about what dietary choices are best for you. Grains Whole-grain or whole-wheat bread. Whole-grain or whole-wheat pasta. Brown rice. Orpah Cobb. Bulgur. Whole-grain and low-sodium cereals. Pita bread. Low-fat, low-sodium crackers. Whole-wheat flour tortillas. Vegetables Fresh or frozen vegetables (raw, steamed, roasted, or grilled). Low-sodium or reduced-sodium tomato and vegetable juice. Low-sodium or reduced-sodium tomato sauce and tomato paste. Low-sodium or reduced-sodium canned vegetables. Fruits All fresh, dried, or frozen fruit. Canned fruit in natural juice (without added sugar). Meat and other protein foods Skinless chicken or [place]. Ground chicken or [place]. Pork with fat trimmed off.  Fish and seafood. Egg whites. Dried beans, peas, or lentils. Unsalted nuts, nut butters, and seeds. Unsalted canned beans. Lean cuts of beef with fat trimmed off. Low-sodium, lean deli meat. Dairy Low-fat (1%) or fat-free (skim) milk. Fat-free, low-fat, or reduced-fat cheeses. Nonfat, low-sodium ricotta or cottage cheese. Low-fat or nonfat yogurt. Low-fat, low-sodium cheese. Fats and oils Soft margarine without trans fats. Vegetable oil. Low-fat, reduced-fat, or light mayonnaise and salad dressings (reduced-sodium). Canola, safflower, olive, soybean, and sunflower oils. Avocado. Seasoning and other foods Herbs. Spices. Seasoning mixes without salt. Unsalted popcorn and pretzels. Fat-free sweets. What foods are not recommended? The items listed  may not be a complete list. Talk with your dietitian about what dietary choices are best for you. Grains Baked goods made with fat, such as croissants, muffins, or some breads. Dry pasta or rice meal packs. Vegetables Creamed or fried vegetables. Vegetables in a cheese sauce. Regular canned vegetables (not low-sodium or reduced-sodium). Regular canned tomato sauce and paste (not low-sodium or reduced-sodium). Regular tomato and vegetable juice (not low-sodium or reduced-sodium). Rosita Fire. Olives. Fruits Canned fruit in a light or heavy syrup. Fried fruit. Fruit in cream or butter sauce. Meat and other protein foods Fatty cuts of meat. Ribs. Fried meat. Tomasa Blase. Sausage. Bologna and other processed lunch meats. Salami. Fatback. Hotdogs. Bratwurst. Salted nuts and seeds. Canned beans with added salt. Canned or smoked fish. Whole eggs or egg yolks. Chicken or [place] with skin. Dairy Whole or 2% milk, cream, and half-and-half. Whole or full-fat cream cheese. Whole-fat or sweetened yogurt. Full-fat cheese. Nondairy creamers. Whipped toppings. Processed cheese and cheese spreads. Fats and oils Butter. Stick margarine. Lard. Shortening. Ghee. Bacon fat. Tropical oils, such as coconut, palm kernel, or palm oil. Seasoning and other foods Salted popcorn and pretzels. Onion salt, garlic salt, seasoned salt, table salt, and sea salt. Worcestershire sauce. Tartar sauce. Barbecue sauce. Teriyaki sauce. Soy sauce, including reduced-sodium. Steak sauce. Canned and packaged gravies. Fish sauce. Oyster sauce. Cocktail sauce. Horseradish that you find on the shelf. Ketchup. Mustard. Meat flavorings and tenderizers. Bouillon cubes. Hot sauce and Tabasco sauce. Premade or packaged marinades. Premade or packaged taco seasonings. Relishes. Regular salad dressings. Where to find more information:  National Heart, Lung, and Blood Institute: PopSteam.is  American Heart Association: www.heart.org Summary  The DASH  eating plan is a healthy eating plan that has been shown to reduce high blood pressure (hypertension). It may also reduce your risk for type 2 diabetes, heart disease, and stroke.  With the DASH eating plan, you should limit salt (sodium) intake to 2,300 mg a day. If you have hypertension, you may need to reduce your sodium intake to 1,500 mg a day.  When on the DASH eating plan, aim to eat more fresh fruits and vegetables, whole grains, lean proteins, low-fat dairy, and heart-healthy fats.  Work with your health care provider or diet and nutrition specialist (dietitian) to adjust your eating plan to your individual calorie needs. This information is not intended to replace advice given to you by your health care provider. Make sure you discuss any questions you have with your health care provider. Document Released: 01/18/2011 Document Revised: 01/23/2016 Document Reviewed: 01/23/2016 Elsevier Interactive Patient Education  2018 Elsevier Inc. Carpal Tunnel Syndrome Carpal tunnel syndrome is a condition that causes pain in your hand and arm. The carpal tunnel is a narrow area that is on the palm side of your wrist. Repeated wrist motion or certain diseases may cause swelling in the tunnel. This swelling  can pinch the main nerve in the wrist (median nerve). Follow these instructions at home: If you have a splint:  Wear it as told by your doctor. Remove it only as told by your doctor.  Loosen the splint if your fingers: ? Become numb and tingle. ? Turn blue and cold.  Keep the splint clean and dry. General instructions  Take over-the-counter and prescription medicines only as told by your doctor.  Rest your wrist from any activity that may be causing your pain. If needed, talk to your employer about changes that can be made in your work, such as getting a wrist pad to use while typing.  If directed, apply ice to the painful area: ? Put ice in a plastic bag. ? Place a towel between your  skin and the bag. ? Leave the ice on for 20 minutes, 2-3 times per day.  Keep all follow-up visits as told by your doctor. This is important.  Do any exercises as told by your doctor, physical therapist, or occupational therapist. Contact a doctor if:  You have new symptoms.  Medicine does not help your pain.  Your symptoms get worse. This information is not intended to replace advice given to you by your health care provider. Make sure you discuss any questions you have with your health care provider. Document Released: 01/18/2011 Document Revised: 07/07/2015 Document Reviewed: 06/16/2014 Elsevier Interactive Patient Education  2018 [employer]. Ibuprofen tablets and capsules What is this medicine? IBUPROFEN (eye BYOO proe fen) is a non-steroidal anti-inflammatory drug (NSAID). It is used for dental pain, fever, headaches or migraines, osteoarthritis, rheumatoid arthritis, or painful monthly periods. It can also relieve minor aches and pains caused by a cold, flu, or sore throat. This medicine may be used for other purposes; ask your health care provider or pharmacist if you have questions. COMMON BRAND NAME(S): Advil, Advil Junior Strength, Advil Migraine, Genpril, Ibren, IBU, Midol, Midol Cramps and Body Aches, Motrin, Motrin IB, Motrin Junior Strength, Motrin Migraine Pain, Samson-8, Toxicology Saliva Collection What should I tell my health care provider before I take this medicine? They need to know if you have any of these conditions: -asthma -cigarette smoker -drink more than 3 alcohol containing drinks a day -heart disease or circulation problems such as heart failure or leg edema (fluid retention) -high blood pressure -kidney disease -liver disease -stomach bleeding or ulcers -an unusual or allergic reaction to ibuprofen, aspirin, other NSAIDS, other medicines, foods, dyes, or preservatives -pregnant or trying to get pregnant -breast-feeding How should I use this  medicine? Take this medicine by mouth with a glass of water. Follow the directions on the prescription label. Take this medicine with food if your stomach gets upset. Try to not lie down for at least 10 minutes after you take the medicine. Take your medicine at regular intervals. Do not take your medicine more often than directed. A special MedGuide will be given to you by the pharmacist with each prescription and refill. Be sure to read this information carefully each time. Talk to your pediatrician regarding the use of this medicine in children. Special care may be needed. Overdosage: If you think you have taken too much of this medicine contact a poison control center or emergency room at once. NOTE: This medicine is only for you. Do not share this medicine with others. What if I miss a dose? If you miss a dose, take it as soon as you can. If it is almost time for your next dose,  take only that dose. Do not take double or extra doses. What may interact with this medicine? Do not take this medicine with any of the following medications: -cidofovir -ketorolac -methotrexate -pemetrexed This medicine may also interact with the following medications: -alcohol -aspirin -diuretics -lithium -other drugs for inflammation like prednisone -warfarin This list may not describe all possible interactions. Give your health care provider a list of all the medicines, herbs, non-prescription drugs, or dietary supplements you use. Also tell them if you smoke, drink alcohol, or use illegal drugs. Some items may interact with your medicine. What should I watch for while using this medicine? Tell your doctor or healthcare professional if your symptoms do not start to get better or if they get worse. This medicine does not prevent heart attack or stroke. In fact, this medicine may increase the chance of a heart attack or stroke. The chance may increase with longer use of this medicine and in people who have heart  disease. If you take aspirin to prevent heart attack or stroke, talk with your doctor or health care professional. Do not take other medicines that contain aspirin, ibuprofen, or naproxen with this medicine. Side effects such as stomach upset, nausea, or ulcers may be more likely to occur. Many medicines available without a prescription should not be taken with this medicine. This medicine can cause ulcers and bleeding in the stomach and intestines at any time during treatment. Ulcers and bleeding can happen without warning symptoms and can cause death. To reduce your risk, do not smoke cigarettes or drink alcohol while you are taking this medicine. You may get drowsy or dizzy. Do not drive, use machinery, or do anything that needs mental alertness until you know how this medicine affects you. Do not stand or sit up quickly, especially if you are an older patient. This reduces the risk of dizzy or fainting spells. This medicine can cause you to bleed more easily. Try to avoid damage to your teeth and gums when you brush or floss your teeth. This medicine may be used to treat migraines. If you take migraine medicines for 10 or more days a month, your migraines may get worse. Keep a diary of headache days and medicine use. Contact your healthcare professional if your migraine attacks occur more frequently. What side effects may I notice from receiving this medicine? Side effects that you should report to your doctor or health care professional as soon as possible: -allergic reactions like skin rash, itching or hives, swelling of the face, lips, or tongue -severe stomach pain -signs and symptoms of bleeding such as bloody or black, tarry stools; red or dark-brown urine; spitting up blood or brown material that looks like coffee grounds; red spots on the skin; unusual bruising or bleeding from the eye, gums, or nose -signs and symptoms of a blood clot such as changes in vision; chest pain; severe, sudden  headache; trouble speaking; sudden numbness or weakness of the face, arm, or leg -unexplained weight gain or swelling -unusually weak or tired -yellowing of eyes or skin Side effects that usually do not require medical attention (report to your doctor or health care professional if they continue or are bothersome): -bruising -diarrhea -dizziness, drowsiness -headache -nausea, vomiting This list may not describe all possible side effects. Call your doctor for medical advice about side effects. You may report side effects to FDA at 1-800-FDA-1088. Where should I keep my medicine? Keep out of the reach of children. Store at room temperature between 15  and 30 degrees C (59 and 86 degrees F). Keep container tightly closed. Throw away any unused medicine after the expiration date. NOTE: This sheet is a summary. It may not cover all possible information. If you have questions about this medicine, talk to your doctor, pharmacist, or health care provider.  2018 Elsevier/Gold Standard (2012-09-30 10:48:02)

## 2017-11-15 LAB — URINALYSIS, ROUTINE W REFLEX MICROSCOPIC
Bilirubin, UA: NEGATIVE
Glucose, UA: NEGATIVE
Ketones, UA: NEGATIVE
Leukocytes, UA: NEGATIVE
Nitrite, UA: NEGATIVE
Protein, UA: NEGATIVE
RBC, UA: NEGATIVE
Specific Gravity, UA: >=1.03 — AB (ref 1.005–1.030)
Urobilinogen, Ur: 1 mg/dL (ref 0.2–1.0)
pH, UA: 5 (ref 5.0–7.5)

## 2017-11-15 LAB — COMPREHENSIVE METABOLIC PANEL
ALT: 16 IU/L (ref 0–32)
AST: 16 IU/L (ref 0–40)
Albumin/Globulin Ratio: 1.3 (ref 1.2–2.2)
Albumin: 3.9 g/dL (ref 3.5–5.5)
Alkaline Phosphatase: 70 IU/L (ref 39–117)
BUN/Creatinine Ratio: 16 (ref 9–23)
BUN: 15 mg/dL (ref 6–20)
Bilirubin Total: 0.2 mg/dL (ref 0.0–1.2)
CO2: 21 mmol/L (ref 20–29)
Calcium: 8.8 mg/dL (ref 8.7–10.2)
Chloride: 102 mmol/L (ref 96–106)
Creatinine, Ser: 0.95 mg/dL (ref 0.57–1.00)
GFR calc Af Amer: 89 mL/min/{1.73_m2} (ref >59)
GFR calc non Af Amer: 77 mL/min/{1.73_m2} (ref >59)
Globulin, Total: 3 g/dL (ref 1.5–4.5)
Glucose: 144 mg/dL — ABNORMAL HIGH (ref 65–99)
Potassium: 3.8 mmol/L (ref 3.5–5.2)
Sodium: 140 mmol/L (ref 134–144)
Total Protein: 6.9 g/dL (ref 6.0–8.5)

## 2017-11-15 LAB — LIPID PANEL
Chol/HDL Ratio: 4.4 ratio (ref 0.0–4.4)
Cholesterol, Total: 114 mg/dL (ref 100–199)
HDL: 26 mg/dL — ABNORMAL LOW (ref >39)
LDL Calculated: 70 mg/dL (ref 0–99)
Triglycerides: 89 mg/dL (ref 0–149)
VLDL Cholesterol Cal: 18 mg/dL (ref 5–40)

## 2017-11-15 LAB — HEMOGLOBIN A1C
Est. average glucose Bld gHb Est-mCnc: 206 mg/dL
Hgb A1c MFr Bld: 8.8 % — ABNORMAL HIGH (ref 4.8–5.6)

## 2017-11-15 LAB — TSH: TSH: 0.586 u[IU]/mL (ref 0.450–4.500)

## 2017-11-20 ENCOUNTER — Ambulatory Visit: Payer: Medicaid Other

## 2017-11-20 LAB — CBC WITH DIFFERENTIAL/PLATELET
Basophils Absolute: 0.1 10*3/uL (ref 0.0–0.2)
Basos: 1 %
EOS (ABSOLUTE): 0 10*3/uL (ref 0.0–0.4)
Eos: 1 %
Hematocrit: 39.4 % (ref 34.0–46.6)
Hemoglobin: 12.6 g/dL (ref 11.1–15.9)
Immature Grans (Abs): 0 10*3/uL (ref 0.0–0.1)
Immature Granulocytes: 0 %
Lymphocytes Absolute: 2.2 10*3/uL (ref 0.7–3.1)
Lymphs: 48 %
MCH: 28.1 pg (ref 26.6–33.0)
MCHC: 32 g/dL (ref 31.5–35.7)
MCV: 88 fL (ref 79–97)
Monocytes Absolute: 0.4 10*3/uL (ref 0.1–0.9)
Monocytes: 9 %
Neutrophils Absolute: 1.9 10*3/uL (ref 1.4–7.0)
Neutrophils: 41 %
Platelets: 172 10*3/uL (ref 150–450)
RBC: 4.48 x10E6/uL (ref 3.77–5.28)
RDW: 14.2 % (ref 12.3–15.4)
WBC: 4.5 10*3/uL (ref 3.4–10.8)

## 2017-11-28 ENCOUNTER — Encounter: Payer: Self-pay | Admitting: Adult Health Nurse Practitioner

## 2017-11-28 ENCOUNTER — Ambulatory Visit: Payer: Medicaid Other | Admitting: Adult Health Nurse Practitioner

## 2017-11-28 DIAGNOSIS — E119 Type 2 diabetes mellitus without complications: Secondary | ICD-10-CM

## 2017-11-28 DIAGNOSIS — Z794 Long term (current) use of insulin: Principal | ICD-10-CM

## 2017-11-28 MED ORDER — LIRAGLUTIDE 18 MG/3ML ~~LOC~~ SOPN: 1.8000 mg | PEN_INJECTOR | Freq: Every morning | SUBCUTANEOUS | 4 refills | Status: DC

## 2017-11-28 MED ORDER — INSULIN ASPART 100 UNIT/ML ~~LOC~~ SOLN: 7.0000 [IU] | Freq: Three times a day (TID) | SUBCUTANEOUS | 11 refills | Status: AC

## 2017-11-28 MED ORDER — INSULIN DETEMIR 100 UNIT/ML FLEXPEN: 65.0000 [IU] | PEN_INJECTOR | Freq: Every day | SUBCUTANEOUS | 11 refills | Status: DC

## 2017-11-28 NOTE — Progress Notes (Signed)
  Patient: Megan Stanton Female    DOB: 19-Mar-1980   37 y.o.   MRN: [id number] Visit Date: 11/28/2017  Today's Provider: Jacelyn Pi, NP   Chief Complaint  Patient presents with  . Follow-up   Subjective:    HPI   Here for lab results.   Taking Levemir and Novolog as prescribed.  Hgb A1c 8.8.  Pt states that she has difficulty swallowing pills.  Has been on victoza in the past with good results.  CBGs averaging 230-250.  LDL 70.     Allergies  Allergen Reactions  . Penicillins Hives   Previous Medications   FOLIC ACID (FOLVITE) 1 MG TABLET    Take 1 mg by mouth daily.   IBUPROFEN (ADVIL,MOTRIN) 800 MG TABLET    Take 1 tablet (800 mg total) by mouth every 8 (eight) hours as needed.   INSULIN ASPART (NOVOLOG) 100 UNIT/ML INJECTION    Inject 0-20 Units into the skin 3 (three) times daily with meals.   INSULIN DETEMIR (LEVEMIR) 100 UNIT/ML PEN    Inject 65 Units into the skin daily.    Review of Systems  All other systems reviewed and are negative.   Social History   Tobacco Use  . Smoking status: Never Smoker  . Smokeless tobacco: Never Used  Substance Use Topics  . Alcohol use: No   Objective:   BP 110/77   Pulse 90   Temp 98 F (36.7 C)   Wt 267 lb 14.4 oz (121.5 kg)   BMI 42.59 kg/m   Physical Exam  Constitutional: She appears well-developed and well-nourished.  HENT:  Head: Normocephalic and atraumatic.  Cardiovascular: Normal rate, regular rhythm, normal heart sounds and intact distal pulses.  Pulmonary/Chest: Effort normal and breath sounds normal.  Abdominal: Soft. Bowel sounds are normal.  Vitals reviewed.       Assessment & Plan:     DM:  Not controlled.  Encourage diabetic diet and exercise.  Continue Levemir 65 units daily, Increase Novolog to 8 units with meals, add Vicotza.  Continue CBG TID.   FU in 3 months for routine care with labs a week prior.         Jacelyn Pi, NP   [facility]

## 2017-12-18 ENCOUNTER — Telehealth: Payer: Self-pay | Admitting: Pharmacy Technician

## 2017-12-18 NOTE — Telephone Encounter (Signed)
Received proof of income.  Patient eligible to receive medication assistance at [facility] as long as eligibility requirements continue to be met.  [facility]

## 2017-12-19 ENCOUNTER — Other Ambulatory Visit: Payer: Medicaid Other

## 2017-12-26 ENCOUNTER — Ambulatory Visit: Payer: Medicaid Other

## 2018-02-24 ENCOUNTER — Telehealth: Payer: Self-pay | Admitting: Pharmacist

## 2018-02-24 NOTE — Telephone Encounter (Signed)
02/24/2018 10:03:10 AM - Levemir Flextouch & tips  02/24/2018 Faxed [employer] application for enrollment - Levemir Flextouch Inject 65 units under the skin every day, #1 box & Novofine 32G tips #2 boxes.Forde Radon

## 2018-03-20 ENCOUNTER — Other Ambulatory Visit: Payer: Medicaid Other

## 2018-03-26 ENCOUNTER — Other Ambulatory Visit: Payer: Medicaid Other

## 2018-03-26 DIAGNOSIS — Z794 Long term (current) use of insulin: Principal | ICD-10-CM

## 2018-03-26 DIAGNOSIS — E119 Type 2 diabetes mellitus without complications: Secondary | ICD-10-CM

## 2018-03-27 ENCOUNTER — Ambulatory Visit: Payer: Medicaid Other

## 2018-03-27 LAB — COMPREHENSIVE METABOLIC PANEL
A/G RATIO: 1.4 (ref 1.2–2.2)
ALBUMIN: 4 g/dL (ref 3.8–4.8)
ALK PHOS: 67 IU/L (ref 39–117)
ALT: 11 IU/L (ref 0–32)
AST: 12 IU/L (ref 0–40)
BILIRUBIN TOTAL: 0.3 mg/dL (ref 0.0–1.2)
BUN / CREAT RATIO: 16 (ref 9–23)
BUN: 13 mg/dL (ref 6–20)
CHLORIDE: 105 mmol/L (ref 96–106)
CO2: 21 mmol/L (ref 20–29)
Calcium: 9.1 mg/dL (ref 8.7–10.2)
Creatinine, Ser: 0.8 mg/dL (ref 0.57–1.00)
GFR calc Af Amer: 109 mL/min/{1.73_m2} (ref >59)
GFR calc non Af Amer: 94 mL/min/{1.73_m2} (ref >59)
GLOBULIN, TOTAL: 2.9 g/dL (ref 1.5–4.5)
Glucose: 176 mg/dL — ABNORMAL HIGH (ref 65–99)
POTASSIUM: 4.2 mmol/L (ref 3.5–5.2)
SODIUM: 141 mmol/L (ref 134–144)
Total Protein: 6.9 g/dL (ref 6.0–8.5)

## 2018-03-27 LAB — HEMOGLOBIN A1C
Est. average glucose Bld gHb Est-mCnc: 255 mg/dL
HEMOGLOBIN A1C: 10.5 % — AB (ref 4.8–5.6)

## 2018-04-03 ENCOUNTER — Ambulatory Visit: Payer: Medicaid Other

## 2018-04-10 ENCOUNTER — Encounter: Payer: Self-pay | Admitting: Gerontology

## 2018-04-10 ENCOUNTER — Other Ambulatory Visit: Payer: Self-pay

## 2018-04-10 ENCOUNTER — Ambulatory Visit: Payer: Medicaid Other | Admitting: Gerontology

## 2018-04-10 VITALS — BP 120/81 | HR 89 | Ht 69.0 in | Wt 270.2 lb

## 2018-04-10 DIAGNOSIS — Z794 Long term (current) use of insulin: Principal | ICD-10-CM

## 2018-04-10 DIAGNOSIS — Z0000 Encounter for general adult medical examination without abnormal findings: Secondary | ICD-10-CM

## 2018-04-10 DIAGNOSIS — E119 Type 2 diabetes mellitus without complications: Secondary | ICD-10-CM

## 2018-04-10 NOTE — Patient Instructions (Signed)
Carbohydrate Counting for Diabetes Mellitus, Adult  Carbohydrate counting is a method of keeping track of how many carbohydrates you eat. Eating carbohydrates naturally increases the amount of sugar (glucose) in the blood. Counting how many carbohydrates you eat helps keep your blood glucose within normal limits, which helps you manage your diabetes (diabetes mellitus). It is important to know how many carbohydrates you can safely have in each meal. This is different for every person. A diet and nutrition specialist (registered dietitian) can help you make a meal plan and calculate how many carbohydrates you should have at each meal and snack. Carbohydrates are found in the following foods:  Grains, such as breads and cereals.  Dried beans and soy products.  Starchy vegetables, such as potatoes, peas, and corn.  Fruit and fruit juices.  Milk and yogurt.  Sweets and snack foods, such as cake, cookies, candy, chips, and soft drinks. How do I count carbohydrates? There are two ways to count carbohydrates in food. You can use either of the methods or a combination of both. Reading "Nutrition Facts" on packaged food The "Nutrition Facts" list is included on the labels of almost all packaged foods and beverages in the [place] It includes:  The serving size.  Information about nutrients in each serving, including the grams (g) of carbohydrate per serving. To use the "Nutrition Facts":  Decide how many servings you will have.  Multiply the number of servings by the number of carbohydrates per serving.  The resulting number is the total amount of carbohydrates that you will be having. Learning standard serving sizes of other foods When you eat carbohydrate foods that are not packaged or do not include "Nutrition Facts" on the label, you need to measure the servings in order to count the amount of carbohydrates:  Measure the foods that you will eat with a food scale or measuring cup, if needed.   Decide how many standard-size servings you will eat.  Multiply the number of servings by 15. Most carbohydrate-rich foods have about 15 g of carbohydrates per serving. ? For example, if you eat 8 oz (170 g) of strawberries, you will have eaten 2 servings and 30 g of carbohydrates (2 servings x 15 g = 30 g).  For foods that have more than one food mixed, such as soups and casseroles, you must count the carbohydrates in each food that is included. The following list contains standard serving sizes of common carbohydrate-rich foods. Each of these servings has about 15 g of carbohydrates:   hamburger bun or  English muffin.   oz (15 mL) syrup.   oz (14 g) jelly.  1 slice of bread.  1 six-inch tortilla.  3 oz (85 g) cooked rice or pasta.  4 oz (113 g) cooked dried beans.  4 oz (113 g) starchy vegetable, such as peas, corn, or potatoes.  4 oz (113 g) hot cereal.  4 oz (113 g) mashed potatoes or  of a large baked potato.  4 oz (113 g) canned or frozen fruit.  4 oz (120 mL) fruit juice.  4-6 crackers.  6 chicken nuggets.  6 oz (170 g) unsweetened dry cereal.  6 oz (170 g) plain fat-free yogurt or yogurt sweetened with artificial sweeteners.  8 oz (240 mL) milk.  8 oz (170 g) fresh fruit or one small piece of fruit.  24 oz (680 g) popped popcorn. Example of carbohydrate counting Sample meal  3 oz (85 g) chicken breast.  6 oz (170 g)   brown rice.  4 oz (113 g) corn.  8 oz (240 mL) milk.  8 oz (170 g) strawberries with sugar-free whipped topping. Carbohydrate calculation 1. Identify the foods that contain carbohydrates: ? Rice. ? Corn. ? Milk. ? Strawberries. 2. Calculate how many servings you have of each food: ? 2 servings rice. ? 1 serving corn. ? 1 serving milk. ? 1 serving strawberries. 3. Multiply each number of servings by 15 g: ? 2 servings rice x 15 g = 30 g. ? 1 serving corn x 15 g = 15 g. ? 1 serving milk x 15 g = 15 g. ? 1 serving  strawberries x 15 g = 15 g. 4. Add together all of the amounts to find the total grams of carbohydrates eaten: ? 30 g + 15 g + 15 g + 15 g = 75 g of carbohydrates total. Summary  Carbohydrate counting is a method of keeping track of how many carbohydrates you eat.  Eating carbohydrates naturally increases the amount of sugar (glucose) in the blood.  Counting how many carbohydrates you eat helps keep your blood glucose within normal limits, which helps you manage your diabetes.  A diet and nutrition specialist (registered dietitian) can help you make a meal plan and calculate how many carbohydrates you should have at each meal and snack. This information is not intended to replace advice given to you by your health care provider. Make sure you discuss any questions you have with your health care provider. Document Released: 01/29/2005 Document Revised: 08/08/2016 Document Reviewed: 07/13/2015 Elsevier Interactive Patient Education  2019 Elsevier Inc.  

## 2018-04-10 NOTE — Progress Notes (Signed)
Established Patient Office Visit  Subjective:  Patient ID: Megan Stanton, female    DOB: 08-06-1980  Age: 38 y.o. MRN: [id number]  CC:  Chief Complaint  Patient presents with  . Follow-up    diabetes    HPI Megan Stanton presents for follow up on her diabetes. She reports taking Novolog 5 units bid instead of 7 units tid, Victoza 1.2 units daily, and Levemir 65 units daily. She states that she checks her blood glucose 4 times daily, but  didn't remember her pre breakfast blood glucose. Her blood glucose during visit was 138 mg /dl. Her HgbA1c was 10.5 % on 03/26/18 and her goal is < 7%.She denies hypo/hyperglycemic episodes, peripheral neuropathy and vision changes. She denies chest pain, palpitation, shortness of breath, fever and chills and otherwise she denies further concerns.  Past Medical History:  Diagnosis Date  . Diabetes mellitus 2005   type 2  . Obesity 2017    Past Surgical History:  Procedure Laterality Date  . CESAREAN SECTION N/A 10/07/2015   Procedure: CESAREAN SECTION;  Surgeon: Christeen Douglas, MD;  Location: [facility] ORS;  Service: Obstetrics;  Laterality: N/A;  . DILATION AND CURETTAGE OF UTERUS  2005/2014    Family History  Problem Relation Age of Onset  . Hypertension Mother   . Diabetes Mother        Type 2  . Diabetes Sister   . Hypertension Sister     Social History   Socioeconomic History  . Marital status: Legally Separated    Spouse name: Not on file  . Number of children: 2  . Years of education: Not on file  . Highest education level: Some college, no degree  Occupational History  . Not on file  Social Needs  . Financial resource strain: Somewhat hard  . Food insecurity:    Worry: Never true    Inability: Never true  . Transportation needs:    Medical: No    Non-medical: No  Tobacco Use  . Smoking status: Never Smoker  . Smokeless tobacco: Never Used  Substance and Sexual Activity  . Alcohol use: No  . Drug use: No  .  Sexual activity: Yes    Birth control/protection: Condom  Lifestyle  . Physical activity:    Days per week: Not on file    Minutes per session: Not on file  . Stress: Not on file  Relationships  . Social connections:    Talks on phone: More than three times a week    Gets together: Three times a week    Attends religious service: More than 4 times per year    Active member of club or organization: No    Attends meetings of clubs or organizations: Never    Relationship status: Separated  . Intimate partner violence:    Fear of current or ex partner: No    Emotionally abused: No    Physically abused: No    Forced sexual activity: No  Other Topics Concern  . 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     Outpatient Medications Prior to Visit  Medication Sig Dispense Refill  . insulin aspart (NOVOLOG) 100 UNIT/ML injection Inject 7 Units into the skin 3 (three) times daily with meals. 10 mL 11  . Insulin Detemir (LEVEMIR) 100 UNIT/ML Pen Inject 65 Units into the skin daily. 15 mL 11  . liraglutide (VICTOZA) 18 MG/3ML SOPN Inject 0.3  mLs (1.8 mg total) into the skin every morning. 3 pen 4  . folic acid (FOLVITE) 1 MG tablet Take 1 mg by mouth daily.    Marland Kitchen ibuprofen (ADVIL,MOTRIN) 800 MG tablet Take 1 tablet (800 mg total) by mouth every 8 (eight) hours as needed. (Patient not taking: Reported on 04/10/2018) 30 tablet 0   No facility-administered medications prior to visit.     Allergies  Allergen Reactions  . Penicillins Hives    ROS Review of Systems  Constitutional: Negative.   Eyes: Negative.   Respiratory: Negative.   Cardiovascular: Negative.   Gastrointestinal: Negative.   Endocrine: Negative.   Genitourinary: Negative.   Musculoskeletal: Negative.   Neurological: Negative.       Objective:    Physical Exam  Constitutional: She is oriented to person, place, and time. She appears well-developed and  well-nourished.  HENT:  Head: Normocephalic and atraumatic.  Eyes: Pupils are equal, round, and reactive to light. EOM are normal.  Neck: Normal range of motion.  Cardiovascular: Normal rate and regular rhythm.  Pulmonary/Chest: Effort normal and breath sounds normal.  Abdominal: Soft. Bowel sounds are normal.  Musculoskeletal: Normal range of motion.  Neurological: She is alert and oriented to person, place, and time.  Skin: Skin is warm.  Psychiatric: She has a normal mood and affect.    BP 120/81 (BP Location: Left Arm, Patient Position: Sitting)   Pulse 89   Ht 5\' 9"  (1.753 m)   Wt 270 lb 3.2 oz (122.6 kg)   SpO2 97%   BMI 39.90 kg/m  Wt Readings from Last 3 Encounters:  04/10/18 270 lb 3.2 oz (122.6 kg)  11/28/17 267 lb 14.4 oz (121.5 kg)  11/14/17 268 lb 3.2 oz (121.7 kg)  She was encouraged to make life style changes, decrease caloric intake and exercise 30 minutes daily.   Health Maintenance Due  Topic Date Due  . PNEUMOCOCCAL POLYSACCHARIDE VACCINE AGE 40-64 HIGH RISK  12/13/1982  . OPHTHALMOLOGY EXAM  12/13/1990  . TETANUS/TDAP  12/13/1999  . PAP SMEAR-Modifier  12/12/2001  . URINE MICROALBUMIN  01/20/2016  . INFLUENZA VACCINE  09/12/2017   Foot exam was done, + ve monofilament test. She will be scheduled for Ophthalmology exam and urine collected for Urine/microalbumin.  Lab Results  Component Value Date   TSH 0.586 11/14/2017   Lab Results  Component Value Date   WBC 4.5 11/14/2017   HGB 12.6 11/14/2017   HCT 39.4 11/14/2017   MCV 88 11/14/2017   PLT 172 11/14/2017   Lab Results  Component Value Date   NA 141 03/26/2018   K 4.2 03/26/2018   CO2 21 03/26/2018   GLUCOSE 176 (H) 03/26/2018   BUN 13 03/26/2018   CREATININE 0.80 03/26/2018   BILITOT 0.3 03/26/2018   ALKPHOS 67 03/26/2018   AST 12 03/26/2018   ALT 11 03/26/2018   PROT 6.9 03/26/2018   ALBUMIN 4.0 03/26/2018   CALCIUM 9.1 03/26/2018   ANIONGAP 5 (L) 04/10/2012   Lab Results   Component Value Date   CHOL 114 11/14/2017   Lab Results  Component Value Date   HDL 26 (L) 11/14/2017   She was encouraged to continue on low fat low cholesterol diet and exercise 30 minutes daily. Lab Results  Component Value Date   LDLCALC 70 11/14/2017   Lab Results  Component Value Date   TRIG 89 11/14/2017   Lab Results  Component Value Date   CHOLHDL 4.4 11/14/2017   Lab Results  Component Value Date   HGBA1C 10.5 (H) 03/26/2018   Goal HgbA1c is < 7 %, she was encouraged to adhere to treatment regimen and recheck in 3 months.   Assessment & Plan:     1. Type 2 diabetes mellitus without complication, with long-term current use of insulin (HCC) - Uncontrolled DM due to non compliance, she was encouraged to continue on 7 units of Novolog tid with meals, 65 units of Levemir daily and 1.2 units of Victoza daily. She will check and document blood glucose tid and bring log to follow up in 2 weeks. - She was educated on portion sizes, reading food label and carbohydrate count. - Endocrinology appointment was scheduled.  2. Healthcare maintenance - Ophthalmology exam was scheduled. - Urine Microalbumin w/creat.ratio was collected during visit.   Follow-up: Return in about 2 weeks (around 04/24/2018), or if symptoms worsen or fail to improve.    Darly Fails Trellis Paganini, NP

## 2018-04-11 LAB — MICROALBUMIN / CREATININE URINE RATIO
CREATININE, UR: 162.4 mg/dL
Microalb/Creat Ratio: 7 mg/g creat (ref 0–29)
Microalbumin, Urine: 11.7 ug/mL

## 2018-04-18 ENCOUNTER — Telehealth: Payer: Self-pay | Admitting: Pharmacist

## 2018-04-18 NOTE — Telephone Encounter (Signed)
04/18/2018 10:31:49 AM - Victoza pens & tips  04/18/2018 Faxed [employer] application for [employer] Inject 1.2mg  once daily & Novofine 32G tips.Forde Radon

## 2018-04-21 ENCOUNTER — Other Ambulatory Visit: Payer: Self-pay | Admitting: Nurse Practitioner

## 2018-04-21 DIAGNOSIS — Z124 Encounter for screening for malignant neoplasm of cervix: Secondary | ICD-10-CM

## 2018-04-21 NOTE — Progress Notes (Signed)
Patient: Megan Stanton           Date of Birth: 1980/03/20           MRN: [id number] Visit Date: 04/21/2018 PCP: Department, [facility]  Cervical Cancer Screening Do you smoke?: No Have you ever had or been told you have an allergy to latex products?: No Marital status: Single Date of last pap smear: Don't know Date of last menstrual period: 04/21/18 Number of pregnancies: 6 Number of births: 2 Have you ever had any of the following? Hysterectomy: No Tubal ligation (tubes tied): No Abnormal bleeding: No Abnormal pap smear: Yes Venereal warts: No A sex partner with venereal warts: No A high risk* sex partner: No  Cervical Exam Pap smear completed: Pap test Abnormal Observations: Bleeding in vagina and visible from cervix. Per patient, she started her menstrual period today. No visible lesions. Recommendations: Follow up based on pap. Suspect inadequate sample based on menstrual bleeding and if so, plan to repeat.       Patient's History Patient Active Problem List   Diagnosis Date Noted  . GDM (gestational diabetes mellitus) 10/03/2015  . Indication for care in labor or delivery 09/16/2015  . Twin pregnancy, twins dichorionic and diamniotic 08/31/2015  . Labor and delivery, indication for care 08/24/2015  . History of stillbirth in currently pregnant patient 06/20/2015  . Single umbilical artery, maternal, antepartum 06/20/2015  . Multigravida of advanced maternal age in first trimester 04/18/2015  . Dichorionic diamniotic twin pregnancy in first trimester 04/18/2015  . Diabetes (HCC) 03/01/2015   Past Medical History:  Diagnosis Date  . Diabetes mellitus 2005   type 2  . Obesity 2017    Family History  Problem Relation Age of Onset  . Hypertension Mother   . Diabetes Mother        Type 2  . Diabetes Sister   . Hypertension Sister     Social History   Occupational History  . Not on file  Tobacco Use  . Smoking status: Never Smoker  .  Smokeless tobacco: Never Used  Substance and Sexual Activity  . Alcohol use: No  . Drug use: No  . Sexual activity: Yes    Birth control/protection: Condom

## 2018-04-22 ENCOUNTER — Telehealth: Payer: Self-pay | Admitting: Pharmacy Technician

## 2018-04-22 NOTE — Telephone Encounter (Signed)
Received 2020 proof of income.  Patient eligible to receive medication assistance at [facility] as long as eligibility requirements continue to be met.  [facility]

## 2018-04-24 ENCOUNTER — Other Ambulatory Visit: Payer: Self-pay

## 2018-04-24 ENCOUNTER — Ambulatory Visit: Payer: Medicaid Other | Admitting: Gerontology

## 2018-04-24 ENCOUNTER — Ambulatory Visit: Payer: Medicaid Other | Admitting: Urology

## 2018-04-24 VITALS — BP 117/77 | HR 97 | Temp 97.9°F | Ht 68.0 in | Wt 274.0 lb

## 2018-04-24 DIAGNOSIS — E119 Type 2 diabetes mellitus without complications: Secondary | ICD-10-CM

## 2018-04-24 DIAGNOSIS — Z794 Long term (current) use of insulin: Principal | ICD-10-CM

## 2018-04-24 NOTE — Progress Notes (Signed)
Established Patient Office Visit  Subjective:  Patient ID: Megan Stanton, female    DOB: [birth year]  Age: 38 y.o. MRN: [id number]  CC:  Chief Complaint  Patient presents with  . Follow-up    HPI Megan Stanton presents for follow up on her diabetes. She reports taking Novolog 7 units tid, Victoza 1.2 units daily and Levemir 65 units daily. She states that she checks her blood glucose 3 times daily, BS improving over the last two days post prandial glucose 120 -180.  Her HgbA1c was 10.5 % on 03/26/18 and her goal is < 7%.  She denies hypo/hyperglycemic episodes, peripheral neuropathy and vision changes.  She denies chest pain, palpitation, shortness of breath, fever and chills and otherwise she denies further concerns.  Past Medical History:  Diagnosis Date  . Diabetes mellitus 2005   type 2  . Obesity 2017    Past Surgical History:  Procedure Laterality Date  . CESAREAN SECTION N/A 10/07/2015   Procedure: CESAREAN SECTION;  Surgeon: Christeen Douglas, MD;  Location: [facility] ORS;  Service: Obstetrics;  Laterality: N/A;  . DILATION AND CURETTAGE OF UTERUS  2005/2014    Family History  Problem Relation Age of Onset  . Hypertension Mother   . Diabetes Mother        Type 2  . Diabetes Sister   . Hypertension Sister     Social History   Socioeconomic History  . Marital status: Legally Separated    Spouse name: Not on file  . Number of children: 2  . Years of education: Not on file  . Highest education level: Some college, no degree  Occupational History  . Not on file  Social Needs  . Financial resource strain: Somewhat hard  . Food insecurity:    Worry: Never true    Inability: Never true  . Transportation needs:    Medical: No    Non-medical: No  Tobacco Use  . Smoking status: Never Smoker  . Smokeless tobacco: Never Used  Substance and Sexual Activity  . Alcohol use: No  . Drug use: No  . Sexual activity: Yes    Birth control/protection: Condom  Lifestyle   . Physical activity:    Days per week: Not on file    Minutes per session: Not on file  . Stress: Not on file  Relationships  . Social connections:    Talks on phone: More than three times a week    Gets together: Three times a week    Attends religious service: More than 4 times per year    Active member of club or organization: No    Attends meetings of clubs or organizations: Never    Relationship status: Separated  . Intimate partner violence:    Fear of current or ex partner: No    Emotionally abused: No    Physically abused: No    Forced sexual activity: No  Other Topics Concern  . 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     Outpatient Medications Prior to Visit  Medication Sig Dispense Refill  . insulin aspart (NOVOLOG) 100 UNIT/ML injection Inject 7 Units into the skin 3 (three) times daily with meals. 10 mL 11  . Insulin Detemir (LEVEMIR) 100 UNIT/ML Pen Inject 65 Units into the skin daily. 15 mL 11  . liraglutide (VICTOZA) 18 MG/3ML SOPN Inject 0.3 mLs (1.8 mg total) into the skin every morning. 3 pen  4  . folic acid (FOLVITE) 1 MG tablet Take 1 mg by mouth daily.    Marland Kitchen ibuprofen (ADVIL,MOTRIN) 800 MG tablet Take 1 tablet (800 mg total) by mouth every 8 (eight) hours as needed. (Patient not taking: Reported on 04/10/2018) 30 tablet 0   No facility-administered medications prior to visit.     Allergies  Allergen Reactions  . Penicillins Hives    ROS Review of Systems  Constitutional: Negative.   Eyes: Negative.   Respiratory: Negative.   Cardiovascular: Negative.   Gastrointestinal: Negative.   Endocrine: Negative.   Genitourinary: Negative.   Musculoskeletal: Negative.   Neurological: Negative.       Objective:    Physical Exam  Constitutional: She is oriented to person, place, and time. She appears well-developed and well-nourished.  HENT:  Head: Normocephalic and atraumatic.  Eyes:  Pupils are equal, round, and reactive to light. EOM are normal.  Neck: Normal range of motion.  Cardiovascular: Normal rate and regular rhythm.  Pulmonary/Chest: Effort normal and breath sounds normal.  Abdominal: Soft. Bowel sounds are normal.  Musculoskeletal: Normal range of motion.  Neurological: She is alert and oriented to person, place, and time.  Skin: Skin is warm.  Psychiatric: She has a normal mood and affect.    BP 117/77 (BP Location: Left Arm)   Pulse 97   Temp 97.9 F (36.6 C)   Ht 5\' 8"  (1.727 m)   Wt 274 lb (124.3 kg)   BMI 41.66 kg/m  Wt Readings from Last 3 Encounters:  04/24/18 274 lb (124.3 kg)  04/10/18 270 lb 3.2 oz (122.6 kg)  11/28/17 267 lb 14.4 oz (121.5 kg)  She was encouraged to make life style changes, decrease caloric intake and exercise 30 minutes daily.   Health Maintenance Due  Topic Date Due  . PNEUMOCOCCAL POLYSACCHARIDE VACCINE AGE 65-64 HIGH RISK  12/13/1982  . OPHTHALMOLOGY EXAM  12/13/1990  . TETANUS/TDAP  12/13/1999  . PAP SMEAR-Modifier  12/12/2001  . INFLUENZA VACCINE  09/12/2017    Lab Results  Component Value Date   TSH 0.586 11/14/2017   Lab Results  Component Value Date   WBC 4.5 11/14/2017   HGB 12.6 11/14/2017   HCT 39.4 11/14/2017   MCV 88 11/14/2017   PLT 172 11/14/2017   Lab Results  Component Value Date   NA 141 03/26/2018   K 4.2 03/26/2018   CO2 21 03/26/2018   GLUCOSE 176 (H) 03/26/2018   BUN 13 03/26/2018   CREATININE 0.80 03/26/2018   BILITOT 0.3 03/26/2018   ALKPHOS 67 03/26/2018   AST 12 03/26/2018   ALT 11 03/26/2018   PROT 6.9 03/26/2018   ALBUMIN 4.0 03/26/2018   CALCIUM 9.1 03/26/2018   ANIONGAP 5 (L) 04/10/2012   Lab Results  Component Value Date   CHOL 114 11/14/2017   Lab Results  Component Value Date   HDL 26 (L) 11/14/2017   She was encouraged to continue on low fat low cholesterol diet and exercise 30 minutes daily. Lab Results  Component Value Date   LDLCALC 70 11/14/2017    Lab Results  Component Value Date   TRIG 89 11/14/2017   Lab Results  Component Value Date   CHOLHDL 4.4 11/14/2017   Lab Results  Component Value Date   HGBA1C 10.5 (H) 03/26/2018   Goal HgbA1c is < 7 %, she was encouraged to adhere to treatment regimen and recheck in 3 months.  Results for orders placed or performed in visit on  04/10/18  Urine Microalbumin w/creat. ratio  Result Value Ref Range   Creatinine, Urine 162.4 Not Estab. mg/dL   Microalbumin, Urine 40.9 Not Estab. ug/mL   Microalb/Creat Ratio 7 0 - 29 mg/g creat     Assessment & Plan:     1. Type 2 diabetes mellitus without complication, with long-term current use of insulin (HCC) - Uncontrolled DM due to non compliance, she was encouraged to continue on 7 units of Novolog tid with meals, 65 units of Levemir daily and 1.2 units of Victoza daily. She will check and document blood glucose tid and bring log to follow up in 2 weeks. - She was educated on portion sizes, reading food label and carbohydrate count.- she states that she has been trying to eat healthier over the last two days  - Endocrinology appointment was scheduled.  2. Healthcare maintenance - Ophthalmology exam was scheduled.   Follow-up: No follow-ups on file.    Amarri Satterly, PA-C

## 2018-04-25 LAB — CYTOLOGY - PAP: Diagnosis: NEGATIVE

## 2018-04-29 ENCOUNTER — Other Ambulatory Visit: Payer: Self-pay | Admitting: Adult Health Nurse Practitioner

## 2018-05-08 ENCOUNTER — Other Ambulatory Visit: Payer: Self-pay | Admitting: Obstetrics and Gynecology

## 2018-05-08 ENCOUNTER — Ambulatory Visit: Payer: Medicaid Other | Admitting: Ophthalmology

## 2018-05-08 MED ORDER — METRONIDAZOLE 500 MG PO TABS: 500.0000 mg | ORAL_TABLET | Freq: Two times a day (BID) | ORAL | 0 refills | Status: DC

## 2018-05-09 ENCOUNTER — Telehealth (HOSPITAL_COMMUNITY): Payer: Self-pay | Admitting: *Deleted

## 2018-05-09 NOTE — Telephone Encounter (Signed)
Telephoned patient at home number and advised patient of negative pap smear results. Next pap smear due in three years.  Advised patient pap smear did show trichomonas. Medication was called into pharmacy. Advised patient to finish all medication, no alcohol while taking medication, no sexual contact until all medication finished, and if had sexual partner they would need to be treated. Patient voiced understanding.

## 2018-05-19 ENCOUNTER — Telehealth: Payer: Self-pay | Admitting: Pharmacist

## 2018-05-19 NOTE — Telephone Encounter (Signed)
05/19/2018 10:50:09 AM - Levemir Flextouch refill  05/19/2018 Printed [employer] refill request for [employer] Inject 65 units under the skin daily #6, & Novofine 32G tips, sending to [facility] for provider to sign.Forde Radon

## 2018-05-30 ENCOUNTER — Telehealth: Payer: Self-pay | Admitting: Pharmacist

## 2018-05-30 NOTE — Telephone Encounter (Signed)
05/30/2018 10:11:11 AM - Levemir Flextouch & tips refill  05/30/2018 Faxed [employer] refill request for Levemir Flextouch Inject 65 units daily # 6, also Novofine 32G tips.Forde Radon

## 2018-06-04 IMAGING — US US MFM OB FOLLOW-UP
1 series · 14 of 28 positions shown · non-contrast
Comparison: none

[Series 1: us mfm ob follow-up · 0.20mm/px · 14 of 35 slices shown]
[im 2/35]
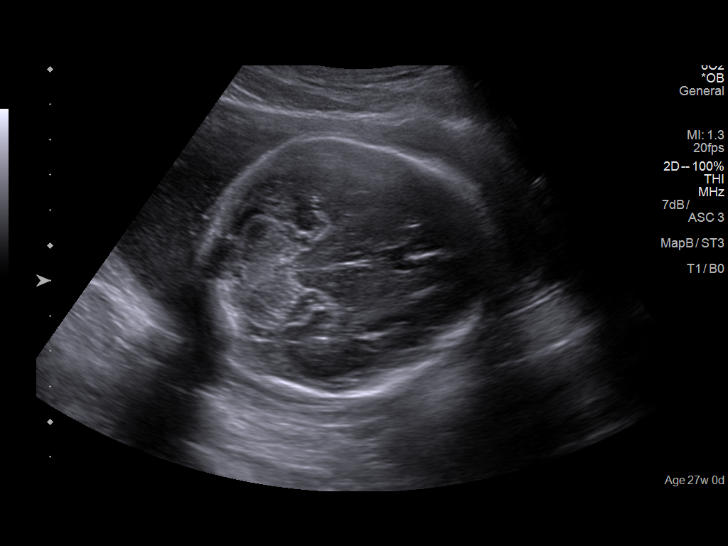
[im 4/35]
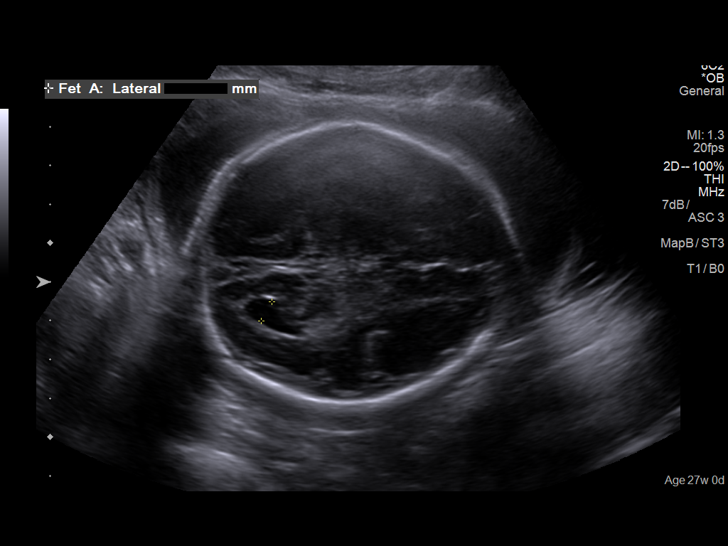
[im 7/35]
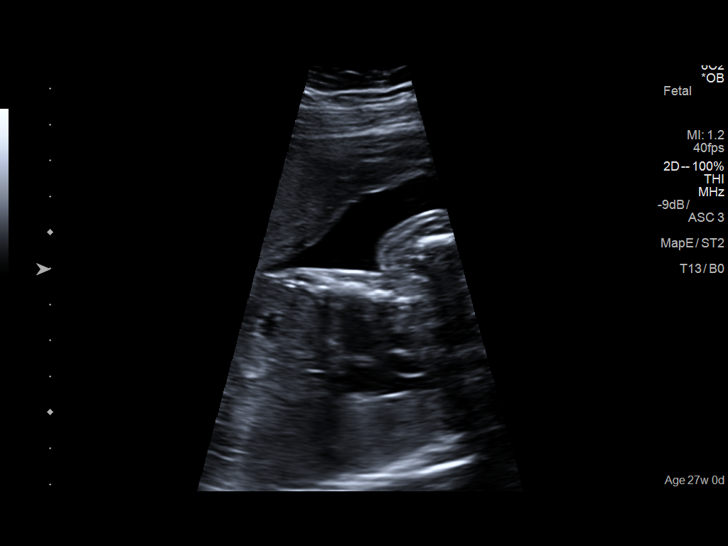
[im 9/35]
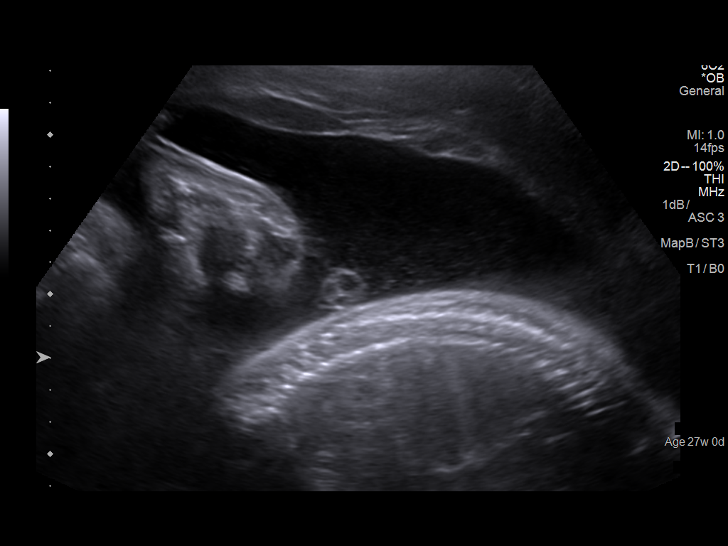
[im 12/35]
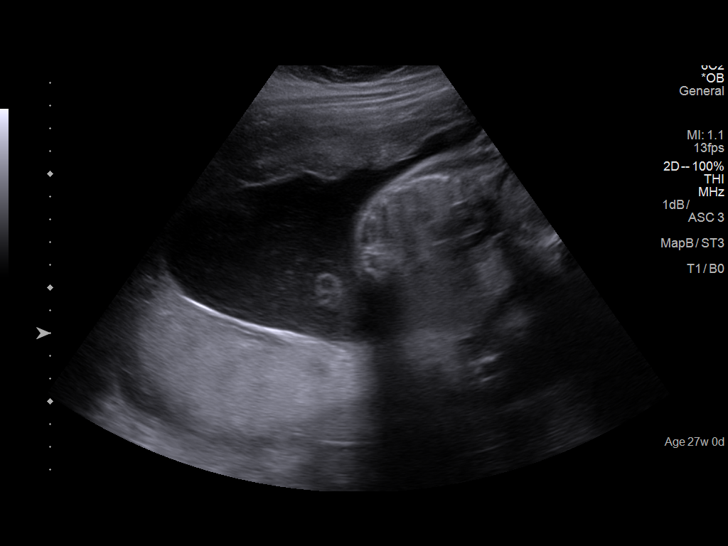
[im 14/35]
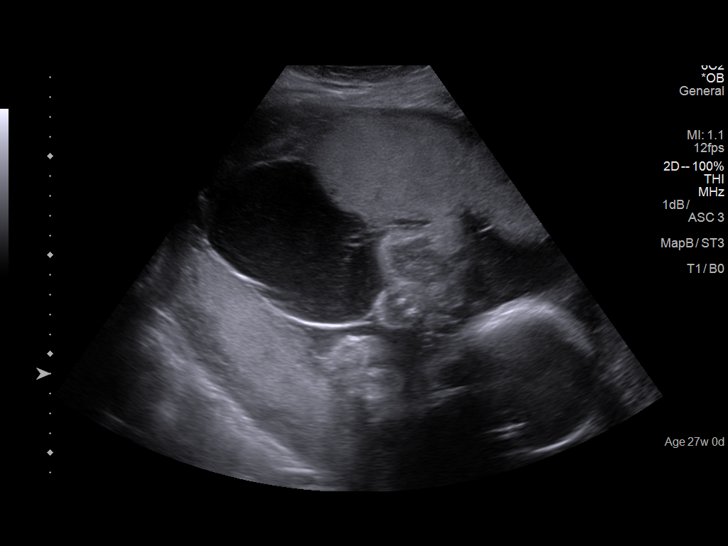
[im 17/35]
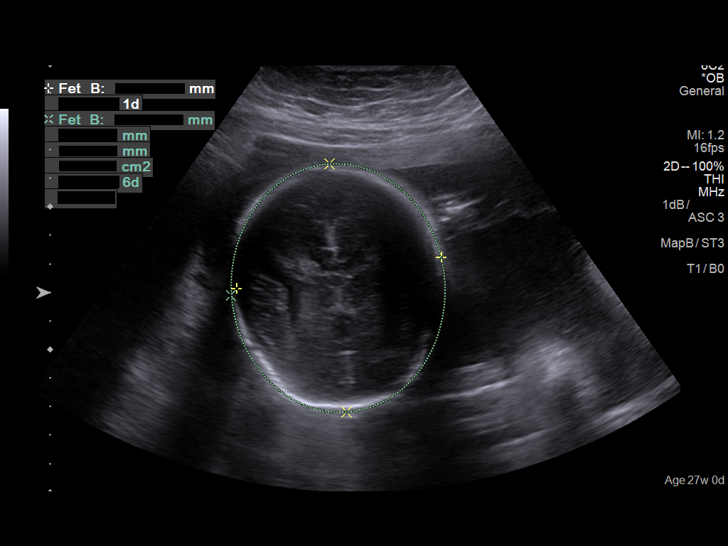
[im 19/35]
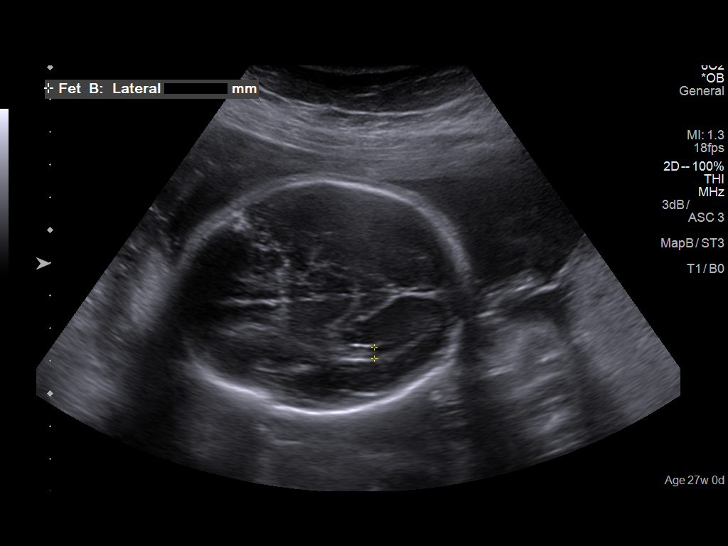
[im 22/35]
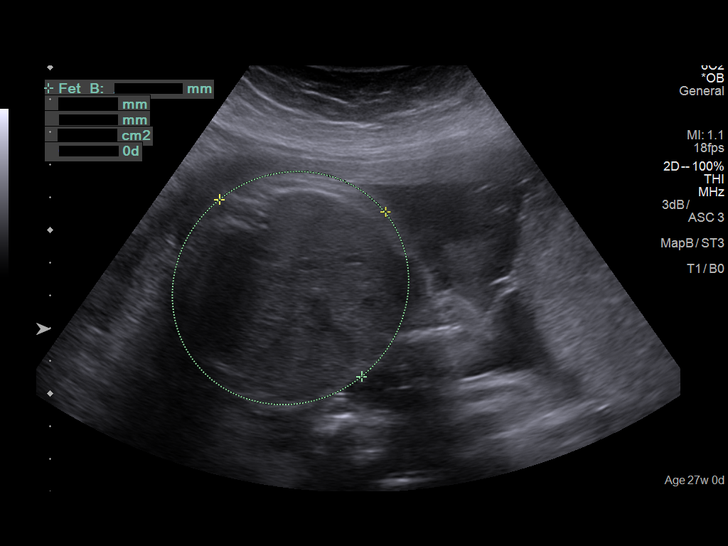
[im 24/35]
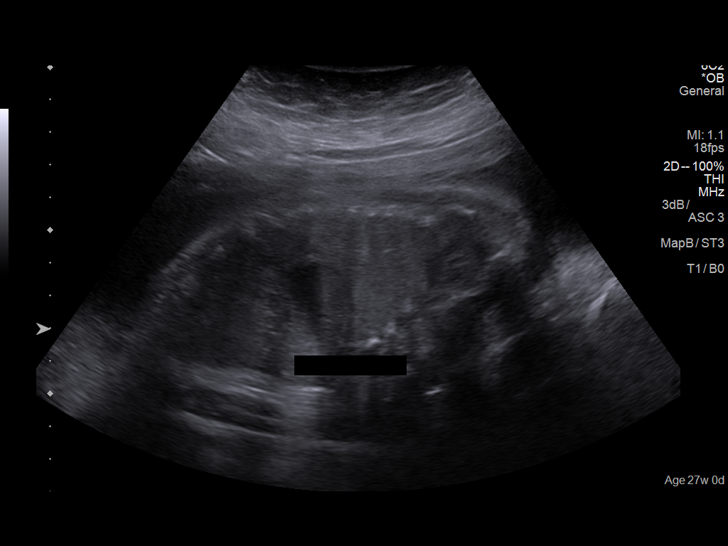
[im 27/35]
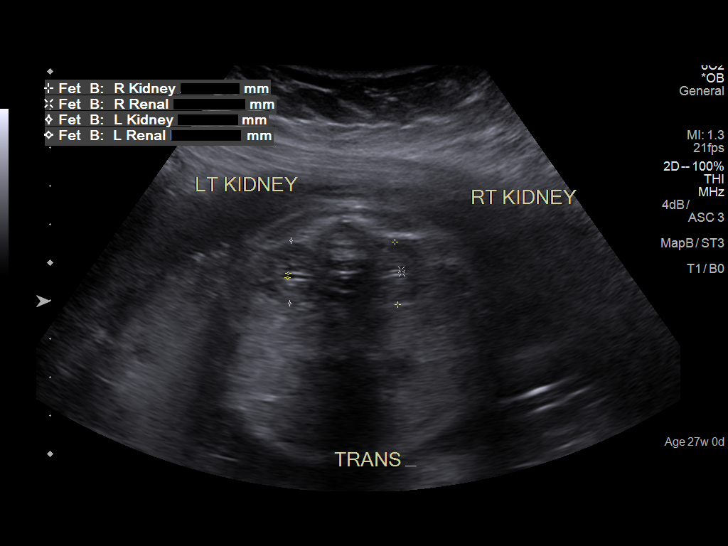
[im 29/35]
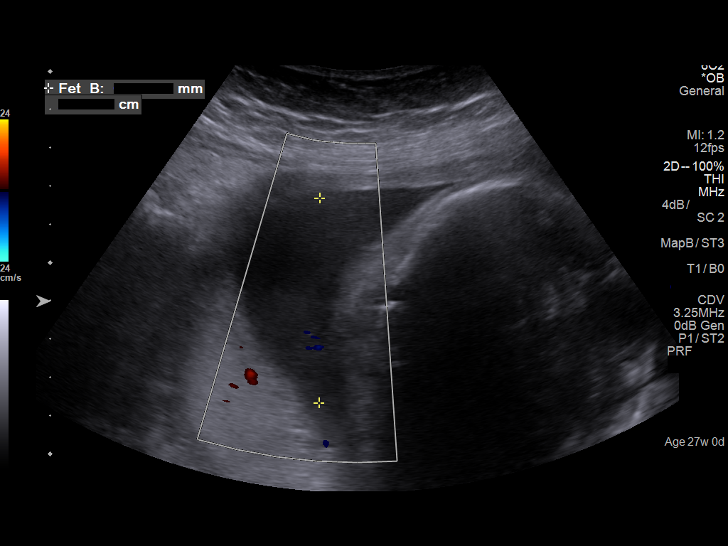
[im 32/35]
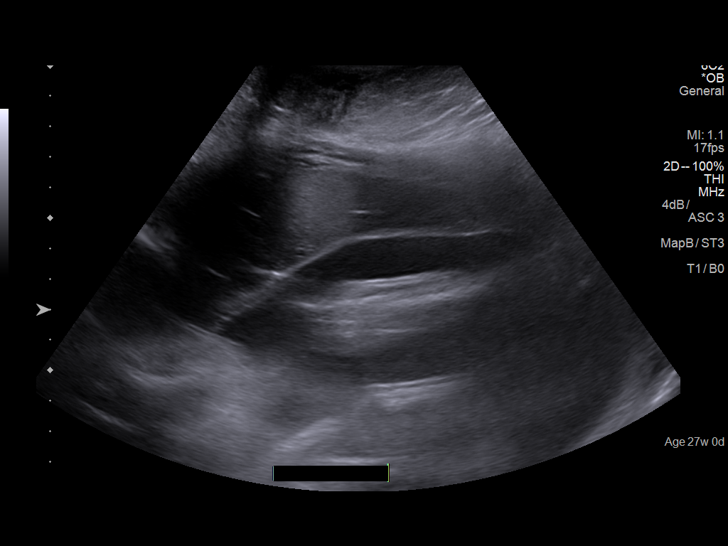
[im 35/35]
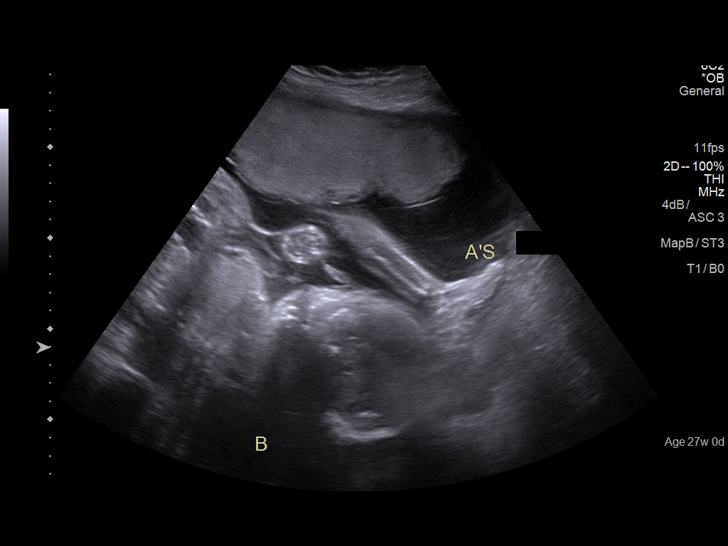

[14 of 28 positions shown; findings below may reference images not displayed]

Canned report from images found in remote index.

Refer to host system for actual result text.

## 2018-07-01 ENCOUNTER — Ambulatory Visit: Payer: Medicaid Other | Admitting: Endocrinology

## 2018-07-01 ENCOUNTER — Other Ambulatory Visit: Payer: Self-pay

## 2018-07-01 ENCOUNTER — Ambulatory Visit: Payer: Medicaid Other

## 2018-07-01 DIAGNOSIS — Z794 Long term (current) use of insulin: Secondary | ICD-10-CM

## 2018-07-01 DIAGNOSIS — E119 Type 2 diabetes mellitus without complications: Secondary | ICD-10-CM

## 2018-07-01 MED ORDER — LIRAGLUTIDE 18 MG/3ML ~~LOC~~ SOPN: PEN_INJECTOR | SUBCUTANEOUS | 4 refills | Status: DC

## 2018-07-01 MED ORDER — INSULIN DEGLUDEC 200 UNIT/ML ~~LOC~~ SOPN: 65.0000 [IU] | PEN_INJECTOR | Freq: Every day | SUBCUTANEOUS | 4 refills | Status: DC

## 2018-07-01 NOTE — Patient Instructions (Signed)
NEEDS CONTRACEPTION!! No unplanned pregnancy.  Suggested she switch Levemir to [place] U-200 at next prescription fill. She can increase by 5 units a week as long as all of her AM sugars are over 130.  She can stop going up when she gets to [place] 160 units a day or most of her AM sugars are between 90 and 130.    I suggested she try to increase Victoza to 1.8 mg a day by going up 1 click at a time  OK to continue Novolog for now, but in an ideal world, [place] U-200 and Victoza 1.8 should do the trick with a low carbohydrate diet.   We could try adding metformin later as well.

## 2018-07-01 NOTE — Progress Notes (Signed)
Endocrinology Consult  Assessment/Plan:   Assessment and Plan: T2DM Megan Stanton is a 38 y.o. female presenting with T2DM for 15 years complicated by long-term insulin use, physical inactivity, poor dieting, and obesity. Her T2DM is currently managed with 65 units of Levemir (in the morning; QD), 1.2 units of Victoza QD (in the morning), and 7 units of Novolog TID before meals. Her most recent A1c was 10.5% (03/26/2018) which was an increase from 8.8 (11/14/2017). Patient denies missing shots --  "loves insulin". Reports a bruise on "top-side" of right foot.  Planned to switch to [place]. Instructed patient to titrate Levemir up 5 units once a week until morning fasting blood sugars are less than 130. Instructed patient that she could increase victoza up to 1.8 units as tolerated (slowly titrating up).  Consulted patient about starting a statin but due to pregnancy potential, will not currently pursue. Consulted about contraception use given patient indicate she did not want to become pregnant.   Follow up on opthalmology exam.   PT Instructions:  Continue with Levemir but slowly titrate up by 5 units once a week if morning blood sugars (before eating) are greater than 130. When the Levemir runs out, switch to [place] (when Levemir runs out) -- start Evaristo Buryresiba does at current Levemir dose and continue to titrate up until morning blood sugars are constantly 130. Can increase Victoza up slowly up to 1.8 units as tolerated.    Subjective:   History of Present Illness:  Megan Doomseresa W Patti is a 38 y.o. female with long-term T2DM (~15 years). Patient reports checking blood sugars TID prior to meals with pre-meal blood sugars in the range of 150-220 and typically seeing 170-200 in the morning when waking up. She denies hypoglycemic episodes/symptoms, gastroporesis, polydipsia, polyuria, urinary incontinence, peripheral neuropathy and any changes in vision. Does endorse feeling "different" when blood  sugar is in the 90s. Also denies chest pain, palpitations, peripheral edema, and shortness of breath.   Patient endorses limited exercise (other than household chores). She reports that she just started the keto-diet and does endorse soda-use. Reports. Ms. Leonides SchanzDorsey denies fear of shots and has a goal A1c of <7%.   Medical History:   Past Medical History:  Diagnosis Date   Diabetes mellitus 2005   type 2   Obesity 2017     Past Surgical History:  Procedure Laterality Date   CESAREAN SECTION N/A 10/07/2015   Procedure: CESAREAN SECTION;  Surgeon: Christeen DouglasBethany Beasley, MD;  Location: [facility] ORS;  Service: Obstetrics;  Laterality: N/A;   DILATION AND CURETTAGE OF UTERUS  2005/2014     Current Outpatient Medications  Medication Sig Dispense Refill   folic acid (FOLVITE) 1 MG tablet Take 1 mg by mouth daily.     ibuprofen (ADVIL,MOTRIN) 800 MG tablet Take 1 tablet (800 mg total) by mouth every 8 (eight) hours as needed. (Patient not taking: Reported on 04/10/2018) 30 tablet 0   insulin aspart (NOVOLOG) 100 UNIT/ML injection Inject 7 Units into the skin 3 (three) times daily with meals. 10 mL 11   Insulin Detemir (LEVEMIR) 100 UNIT/ML Pen Inject 65 Units into the skin daily. 15 mL 11   metroNIDAZOLE (FLAGYL) 500 MG tablet Take 1 tablet (500 mg total) by mouth 2 (two) times daily. 14 tablet 0   VICTOZA 18 MG/3ML SOPN INJECT 1.8MG  UNDER THE SKIN EVERY MORNING 12 mL 0   No current facility-administered medications for this visit.     Allergies  Allergen Reactions   Penicillins  Hives    Family History:  Family History  Problem Relation Age of Onset   Hypertension Mother    Diabetes Mother        Type 2   Diabetes Sister    Hypertension Sister      Review of Systems:  92 Systems questionnaire reviewed with patient and all systems negative except per HPI.  Objective:    Data Review:  Results for orders placed or performed in visit on 04/21/18  Cytology - PAP([facility])  Result Value Ref Range   Adequacy      Satisfactory for evaluation  endocervical/transformation zone component PRESENT.   Diagnosis      NEGATIVE FOR INTRAEPITHELIAL LESIONS OR MALIGNANCY.   Diagnosis TRICHOMONAS VAGINALIS PRESENT.    Material Submitted CervicoVaginal Pap [ThinPrep Imaged]

## 2018-07-02 IMAGING — US US MFM OB FOLLOW-UP
1 series · 14 of 28 positions shown · non-contrast
Comparison: none

[Series 1: us mfm ob follow-up · 0.20mm/px · 14 of 71 slices shown]
[im 3/71]
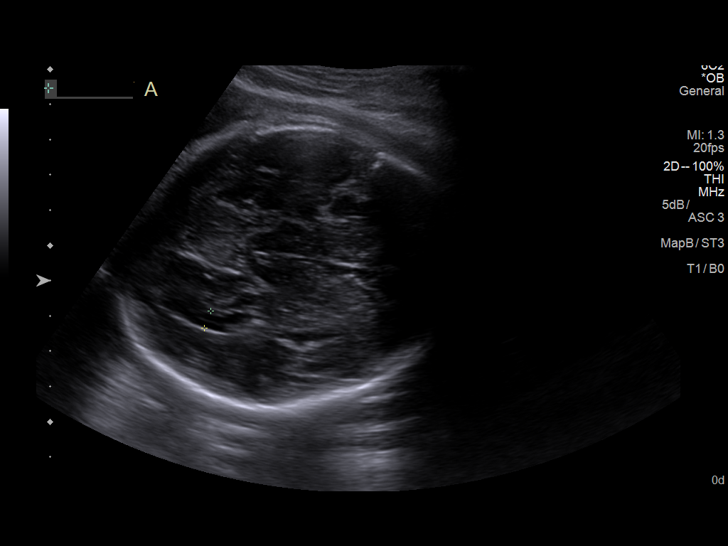
[im 8/71]
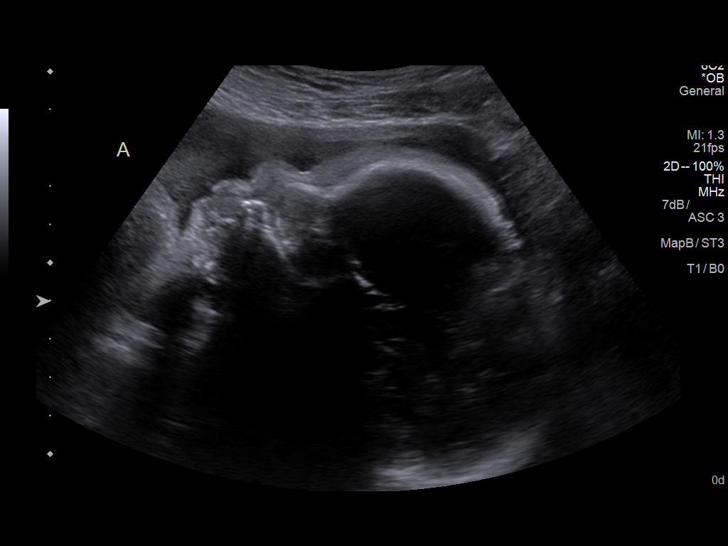
[im 13/71]
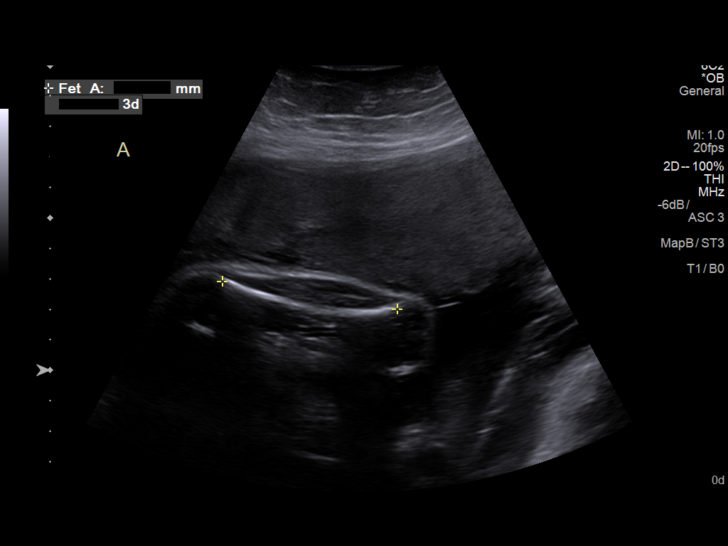
[im 19/71]
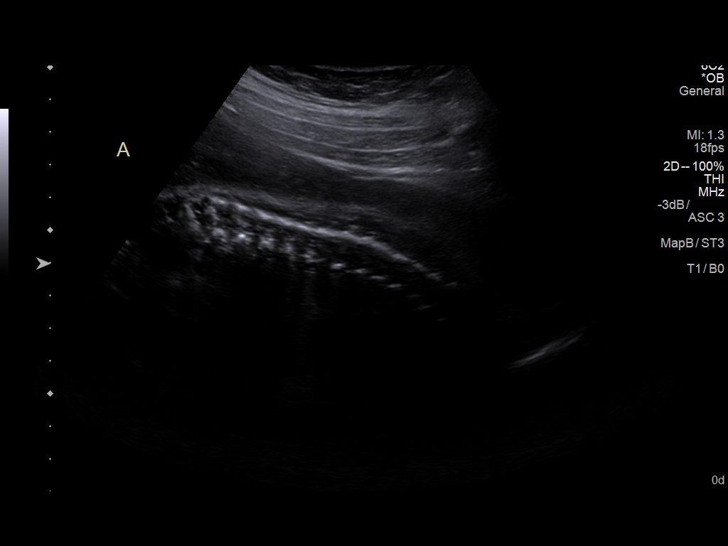
[im 24/71]
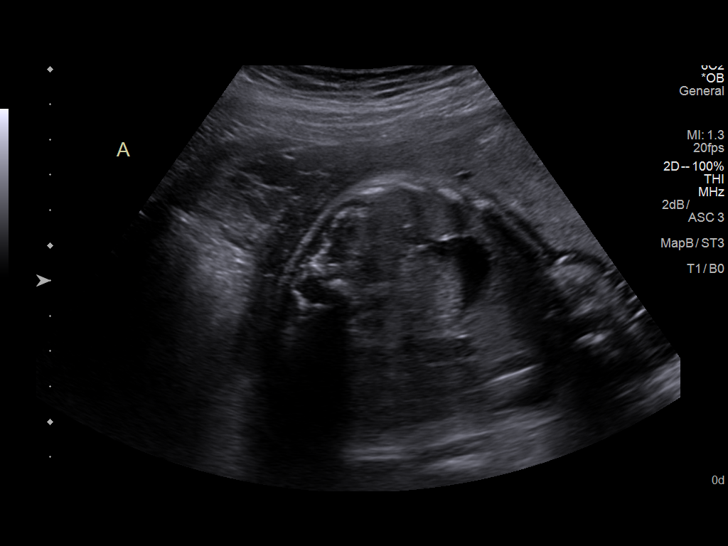
[im 29/71]
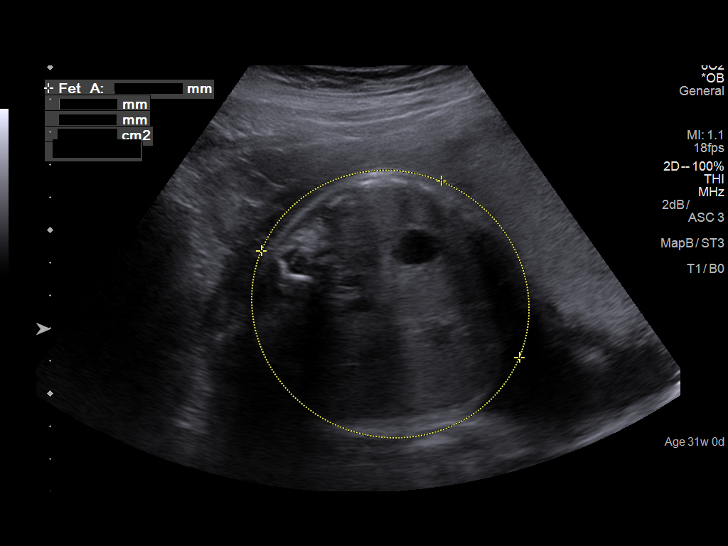
[im 34/71]
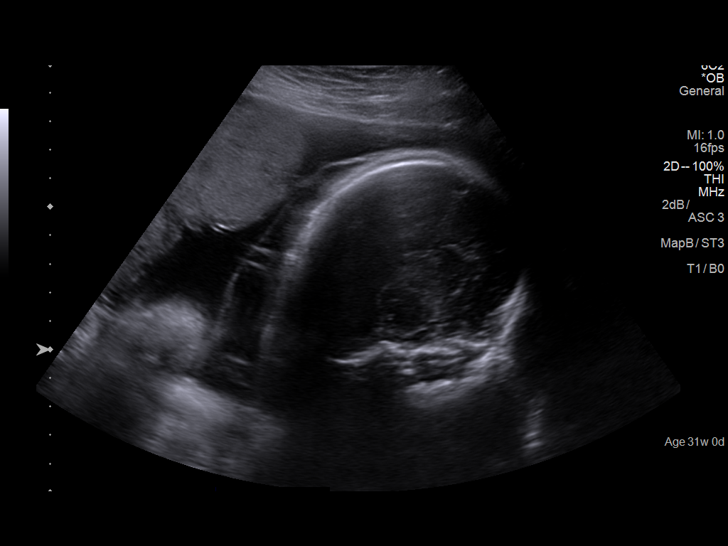
[im 39/71]
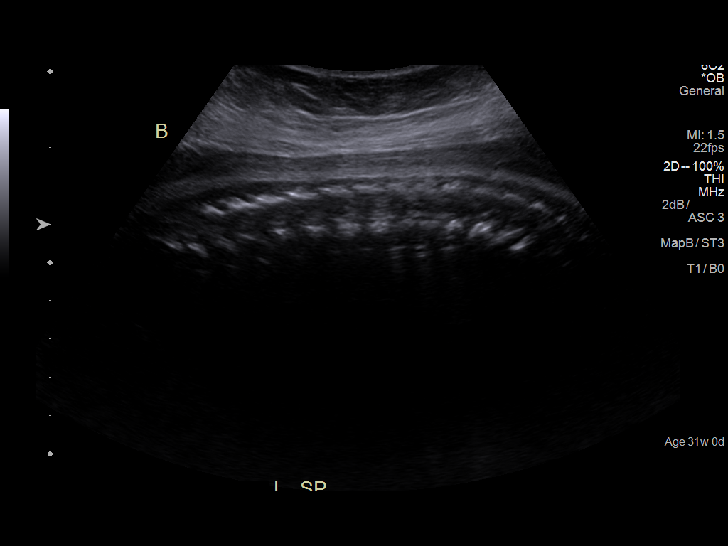
[im 45/71]
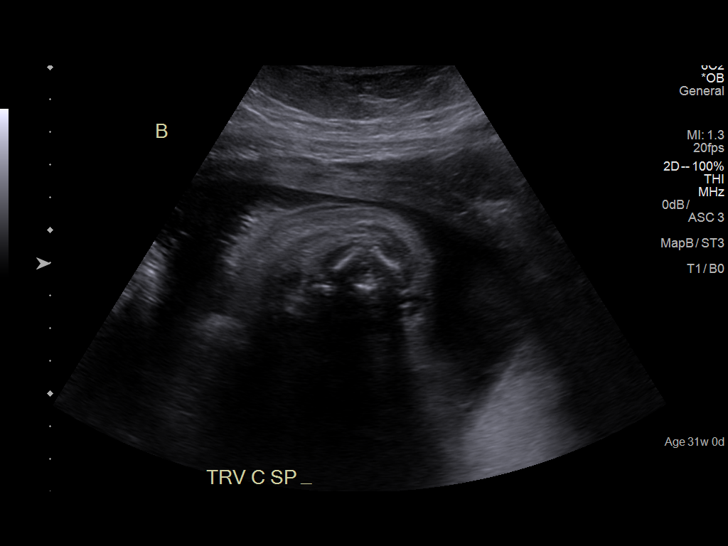
[im 50/71]
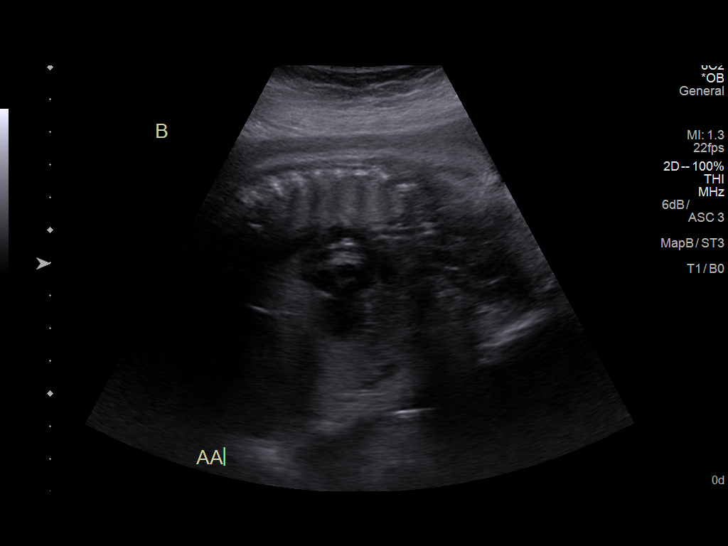
[im 55/71]
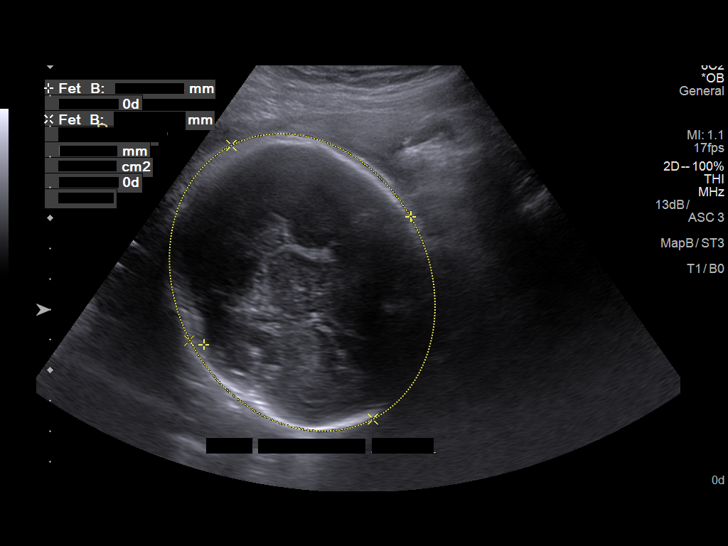
[im 60/71]
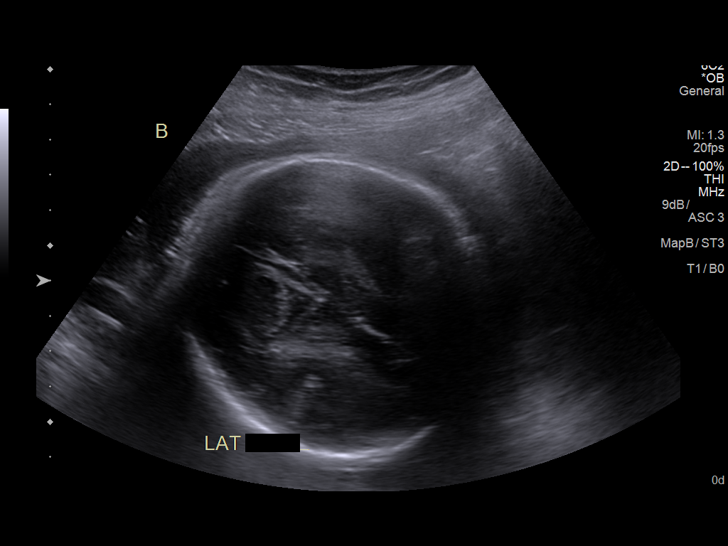
[im 65/71]
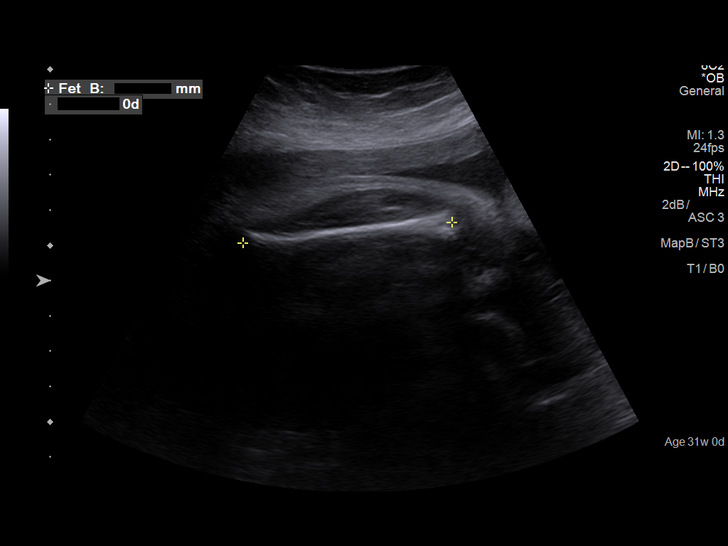
[im 71/71]
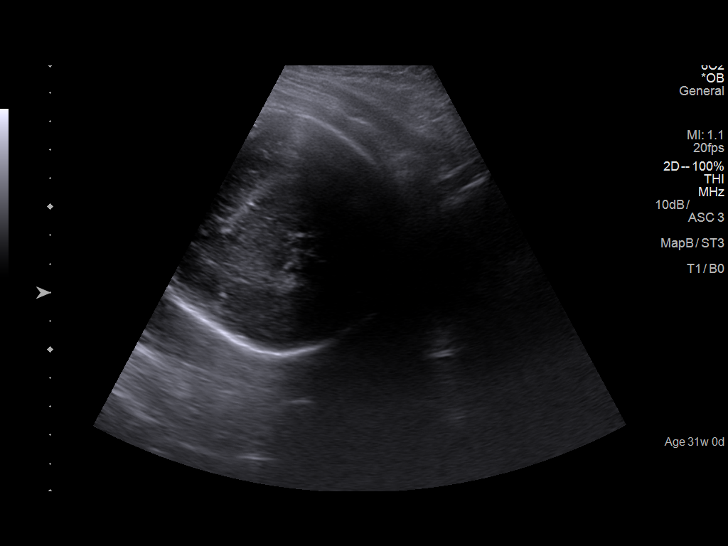

[14 of 28 positions shown; findings below may reference images not displayed]

Canned report from images found in remote index.

Refer to host system for actual result text.

## 2018-07-11 ENCOUNTER — Telehealth: Payer: Self-pay | Admitting: Pharmacist

## 2018-07-11 NOTE — Telephone Encounter (Signed)
07/11/2018 9:45:19 AM - Evaristo [place] U-200 Flextouch & tips  07/11/2018 Faxed New Product reorder form to [employer] for [employer] U-200 Flextouch (replaces Levemir Flextouch) Max daily dose 105 units  -- Inject 66-100 units under the skin daily, increase by 5 units until AM BS is 90-130. and Novofine 32G tips.Forde Radon

## 2018-07-17 ENCOUNTER — Other Ambulatory Visit: Payer: Medicaid Other

## 2018-07-17 ENCOUNTER — Other Ambulatory Visit: Payer: Self-pay

## 2018-07-17 DIAGNOSIS — Z794 Long term (current) use of insulin: Secondary | ICD-10-CM

## 2018-07-17 DIAGNOSIS — E119 Type 2 diabetes mellitus without complications: Secondary | ICD-10-CM

## 2018-07-18 LAB — HEMOGLOBIN A1C
Est. average glucose Bld gHb Est-mCnc: 206 mg/dL
Hgb A1c MFr Bld: 8.8 % — ABNORMAL HIGH (ref 4.8–5.6)

## 2018-07-18 LAB — COMPREHENSIVE METABOLIC PANEL
ALT: 10 IU/L (ref 0–32)
AST: 14 IU/L (ref 0–40)
Albumin/Globulin Ratio: 1.4 (ref 1.2–2.2)
Albumin: 3.8 g/dL (ref 3.8–4.8)
Alkaline Phosphatase: 69 IU/L (ref 39–117)
BUN/Creatinine Ratio: 18 (ref 9–23)
BUN: 16 mg/dL (ref 6–20)
Bilirubin Total: 0.2 mg/dL (ref 0.0–1.2)
CO2: 23 mmol/L (ref 20–29)
Calcium: 8.8 mg/dL (ref 8.7–10.2)
Chloride: 103 mmol/L (ref 96–106)
Creatinine, Ser: 0.9 mg/dL (ref 0.57–1.00)
GFR calc Af Amer: 94 mL/min/{1.73_m2} (ref >59)
GFR calc non Af Amer: 82 mL/min/{1.73_m2} (ref >59)
Globulin, Total: 2.8 g/dL (ref 1.5–4.5)
Glucose: 218 mg/dL — ABNORMAL HIGH (ref 65–99)
Potassium: 4.1 mmol/L (ref 3.5–5.2)
Sodium: 140 mmol/L (ref 134–144)
Total Protein: 6.6 g/dL (ref 6.0–8.5)

## 2018-07-18 LAB — CBC WITH DIFFERENTIAL/PLATELET
Basophils Absolute: 0 10*3/uL (ref 0.0–0.2)
Basos: 1 %
EOS (ABSOLUTE): 0.1 10*3/uL (ref 0.0–0.4)
Eos: 1 %
Hematocrit: 38.3 % (ref 34.0–46.6)
Hemoglobin: 12.6 g/dL (ref 11.1–15.9)
Immature Grans (Abs): 0 10*3/uL (ref 0.0–0.1)
Immature Granulocytes: 0 %
Lymphocytes Absolute: 2.6 10*3/uL (ref 0.7–3.1)
Lymphs: 40 %
MCH: 28.1 pg (ref 26.6–33.0)
MCHC: 32.9 g/dL (ref 31.5–35.7)
MCV: 85 fL (ref 79–97)
Monocytes Absolute: 0.5 10*3/uL (ref 0.1–0.9)
Monocytes: 8 %
Neutrophils Absolute: 3.4 10*3/uL (ref 1.4–7.0)
Neutrophils: 50 %
Platelets: 193 10*3/uL (ref 150–450)
RBC: 4.49 x10E6/uL (ref 3.77–5.28)
RDW: 12.6 % (ref 11.7–15.4)
WBC: 6.6 10*3/uL (ref 3.4–10.8)

## 2018-07-18 LAB — LIPID PANEL
Chol/HDL Ratio: 3.5 ratio (ref 0.0–4.4)
Cholesterol, Total: 126 mg/dL (ref 100–199)
HDL: 36 mg/dL — ABNORMAL LOW (ref >39)
LDL Calculated: 60 mg/dL (ref 0–99)
Triglycerides: 151 mg/dL — ABNORMAL HIGH (ref 0–149)
VLDL Cholesterol Cal: 30 mg/dL (ref 5–40)

## 2018-07-24 ENCOUNTER — Other Ambulatory Visit: Payer: Self-pay

## 2018-07-24 ENCOUNTER — Ambulatory Visit: Payer: Medicaid Other | Admitting: Adult Health Nurse Practitioner

## 2018-07-24 DIAGNOSIS — E119 Type 2 diabetes mellitus without complications: Secondary | ICD-10-CM

## 2018-07-24 NOTE — Progress Notes (Signed)
  Patient: Megan Stanton Female    DOB: 01-09-1981   38 y.o.   MRN: [id number] Visit Date: 07/24/2018  Today's Provider: Staci Acosta, NP   No chief complaint on file.  Subjective:    HPI   Telephonic visit.   Recently seen by Endo for DM management. Last A1c was 8.8 down from 10.5.  Pt states that she has not gotten [place] yet- states that they told her it would be at least a month before she could get the medication. She says that her highest CBG is 200. Denies hypoglycemia.   States she is drinking Iso Tea.    Allergies  Allergen Reactions  . Penicillins Hives   Previous Medications   FOLIC ACID (FOLVITE) 1 MG TABLET    Take 1 mg by mouth daily.   INSULIN ASPART (NOVOLOG) 100 UNIT/ML INJECTION    Inject 7 Units into the skin 3 (three) times daily with meals.   INSULIN DEGLUDEC (TRESIBA FLEXTOUCH) 200 UNIT/ML SOPN    Inject 66-160 Units into the skin daily. Increase by 5 units a week until AM sugar is usually 90-130   INSULIN DETEMIR (LEVEMIR) 100 UNIT/ML PEN    Inject 65 Units into the skin daily.   LIRAGLUTIDE (VICTOZA) 18 MG/3ML SOPN    INJECT 1.8MG  UNDER THE SKIN EVERY MORNING   METRONIDAZOLE (FLAGYL) 500 MG TABLET    Take 1 tablet (500 mg total) by mouth 2 (two) times daily.    Review of Systems  All other systems reviewed and are negative.   Social History   Tobacco Use  . Smoking status: Never Smoker  . Smokeless tobacco: Never Used  Substance Use Topics  . Alcohol use: No   Objective:   There were no vitals taken for this visit.  Physical Exam  No PE.     Assessment & Plan:         DM:  Not controlled but improved.  Encourage diabetic diet and exercise.  Continue current medication regimen as directed by Endo.  FU with Endo as scheduled.   Discussed no unintended pregnancy. Not currently on contraceptive.       Staci Acosta, NP   [facility]

## 2018-08-07 ENCOUNTER — Telehealth: Payer: Self-pay | Admitting: Pharmacist

## 2018-08-07 NOTE — Telephone Encounter (Signed)
08/07/2018 1:47:23 PM - Victoza & tips refill to provider  08/07/2018 Printed [employer] refill request for Victoza 1.8mg  once daily #4, & Novofine 32G tips, taking to [facility] for provider to sign.Megan Stanton

## 2018-09-02 ENCOUNTER — Other Ambulatory Visit: Payer: Self-pay

## 2018-09-02 ENCOUNTER — Ambulatory Visit: Payer: Medicaid Other

## 2018-09-05 ENCOUNTER — Telehealth: Payer: Self-pay | Admitting: Pharmacist

## 2018-09-05 NOTE — Telephone Encounter (Signed)
09/05/2018 12:26:13 PM - Victoza & tips refill faxed to [facility]  09/05/2018 [employer] refill request for Victoza Inject 1.8mg  daily #4, also Novofine 32G tips.Megan Stanton

## 2018-09-12 ENCOUNTER — Other Ambulatory Visit: Payer: Self-pay

## 2018-09-12 DIAGNOSIS — Z20822 Contact with and (suspected) exposure to covid-19: Secondary | ICD-10-CM

## 2018-09-15 ENCOUNTER — Telehealth: Payer: Self-pay | Admitting: General Practice

## 2018-09-15 LAB — NOVEL CORONAVIRUS, NAA: SARS-CoV-2, NAA: NOT DETECTED

## 2018-09-15 NOTE — Telephone Encounter (Signed)
Pt made aware of negative results, expressed understanding 09/15/2018

## 2018-10-09 ENCOUNTER — Telehealth: Payer: Self-pay | Admitting: Pharmacist

## 2018-10-09 NOTE — Telephone Encounter (Signed)
10/09/2018 11:23:04 AM - Megan Stanton U-200 Flextouch refill  10/09/2018 Faxed [employer] refill for Megan Stanton U-200 Flextouch Inject 100 units daily #7.Delos Haring

## 2018-11-11 ENCOUNTER — Telehealth: Payer: Self-pay | Admitting: Gerontology

## 2018-11-11 NOTE — Telephone Encounter (Signed)
Called pt on 9/29 @2 :53 pm to schedule Endo appointment. Call could not be completed.

## 2018-12-31 ENCOUNTER — Telehealth: Payer: Self-pay | Admitting: Pharmacy Technician

## 2018-12-31 NOTE — Telephone Encounter (Signed)
Patient has prescription drug coverage with Medicaid.  No longer meets [facility]'s eligibility criteria.  Pt. Notified.  [facility]

## 2019-01-13 ENCOUNTER — Other Ambulatory Visit: Payer: Self-pay

## 2019-01-13 DIAGNOSIS — Z20822 Contact with and (suspected) exposure to covid-19: Secondary | ICD-10-CM

## 2019-01-14 ENCOUNTER — Telehealth: Payer: Self-pay

## 2019-01-14 NOTE — Telephone Encounter (Signed)
Patient advised that result is not back yet 

## 2019-01-15 ENCOUNTER — Telehealth: Payer: Self-pay

## 2019-01-15 LAB — NOVEL CORONAVIRUS, NAA: SARS-CoV-2, NAA: NOT DETECTED

## 2019-01-15 NOTE — Telephone Encounter (Signed)
Patient given negative result and verbalized understanding  

## 2019-01-22 ENCOUNTER — Ambulatory Visit: Payer: Medicaid Other | Admitting: Family Medicine

## 2019-01-22 ENCOUNTER — Encounter: Payer: Self-pay | Admitting: Family Medicine

## 2019-01-22 ENCOUNTER — Other Ambulatory Visit: Payer: Self-pay

## 2019-01-22 DIAGNOSIS — E119 Type 2 diabetes mellitus without complications: Secondary | ICD-10-CM

## 2019-01-22 DIAGNOSIS — Z794 Long term (current) use of insulin: Secondary | ICD-10-CM

## 2019-01-22 MED ORDER — NOVOLOG FLEXPEN 100 UNIT/ML ~~LOC~~ SOPN: 8.0000 [IU] | PEN_INJECTOR | Freq: Three times a day (TID) | SUBCUTANEOUS | 2 refills | Status: AC

## 2019-01-22 MED ORDER — VICTOZA 18 MG/3ML ~~LOC~~ SOPN: PEN_INJECTOR | SUBCUTANEOUS | 2 refills | Status: AC

## 2019-01-22 MED ORDER — TRESIBA FLEXTOUCH 200 UNIT/ML ~~LOC~~ SOPN: 65.0000 [IU] | PEN_INJECTOR | Freq: Every day | SUBCUTANEOUS | 2 refills | Status: AC

## 2019-01-22 NOTE — Patient Instructions (Signed)
Health Maintenance, Female Adopting a healthy lifestyle and getting preventive care are important in promoting health and wellness. Ask your health care provider about:  The right schedule for you to have regular tests and exams.  Things you can do on your own to prevent diseases and keep yourself healthy. What should I know about diet, weight, and exercise? Eat a healthy diet   Eat a diet that includes plenty of vegetables, fruits, low-fat dairy products, and lean protein.  Do not eat a lot of foods that are high in solid fats, added sugars, or sodium. Maintain a healthy weight Body mass index (BMI) is used to identify weight problems. It estimates body fat based on height and weight. Your health care provider can help determine your BMI and help you achieve or maintain a healthy weight. Get regular exercise Get regular exercise. This is one of the most important things you can do for your health. Most adults should:  Exercise for at least 150 minutes each week. The exercise should increase your heart rate and make you sweat (moderate-intensity exercise).  Do strengthening exercises at least twice a week. This is in addition to the moderate-intensity exercise.  Spend less time sitting. Even light physical activity can be beneficial. Watch cholesterol and blood lipids Have your blood tested for lipids and cholesterol at 38 years of age, then have this test every 5 years. Have your cholesterol levels checked more often if:  Your lipid or cholesterol levels are high.  You are older than 38 years of age.  You are at high risk for heart disease. What should I know about cancer screening? Depending on your health history and family history, you may need to have cancer screening at various ages. This may include screening for:  Breast cancer.  Cervical cancer.  Colorectal cancer.  Skin cancer.  Lung cancer. What should I know about heart disease, diabetes, and high blood  pressure? Blood pressure and heart disease  High blood pressure causes heart disease and increases the risk of stroke. This is more likely to develop in people who have high blood pressure readings, are of African descent, or are overweight.  Have your blood pressure checked: ? Every 3-5 years if you are 18-39 years of age. ? Every year if you are 40 years old or older. Diabetes Have regular diabetes screenings. This checks your fasting blood sugar level. Have the screening done:  Once every three years after age 40 if you are at a normal weight and have a low risk for diabetes.  More often and at a younger age if you are overweight or have a high risk for diabetes. What should I know about preventing infection? Hepatitis B If you have a higher risk for hepatitis B, you should be screened for this virus. Talk with your health care provider to find out if you are at risk for hepatitis B infection. Hepatitis C Testing is recommended for:  Everyone born from 1945 through 1965.  Anyone with known risk factors for hepatitis C. Sexually transmitted infections (STIs)  Get screened for STIs, including gonorrhea and chlamydia, if: ? You are sexually active and are younger than 38 years of age. ? You are older than 38 years of age and your health care provider tells you that you are at risk for this type of infection. ? Your sexual activity has changed since you were last screened, and you are at increased risk for chlamydia or gonorrhea. Ask your health care provider if   you are at risk.  Ask your health care provider about whether you are at high risk for HIV. Your health care provider may recommend a prescription medicine to help prevent HIV infection. If you choose to take medicine to prevent HIV, you should first get tested for HIV. You should then be tested every 3 months for as long as you are taking the medicine. Pregnancy  If you are about to stop having your period (premenopausal) and  you may become pregnant, seek counseling before you get pregnant.  Take 400 to 800 micrograms (mcg) of folic acid every day if you become pregnant.  Ask for birth control (contraception) if you want to prevent pregnancy. Osteoporosis and menopause Osteoporosis is a disease in which the bones lose minerals and strength with aging. This can result in bone fractures. If you are 65 years old or older, or if you are at risk for osteoporosis and fractures, ask your health care provider if you should:  Be screened for bone loss.  Take a calcium or vitamin D supplement to lower your risk of fractures.  Be given hormone replacement therapy (HRT) to treat symptoms of menopause. Follow these instructions at home: Lifestyle  Do not use any products that contain nicotine or tobacco, such as cigarettes, e-cigarettes, and chewing tobacco. If you need help quitting, ask your health care provider.  Do not use street drugs.  Do not share needles.  Ask your health care provider for help if you need support or information about quitting drugs. Alcohol use  Do not drink alcohol if: ? Your health care provider tells you not to drink. ? You are pregnant, may be pregnant, or are planning to become pregnant.  If you drink alcohol: ? Limit how much you use to 0-1 drink a day. ? Limit intake if you are breastfeeding.  Be aware of how much alcohol is in your drink. In the [place] one drink equals one 12 oz bottle of beer (355 mL), one 5 oz glass of wine (148 mL), or one 1 oz glass of hard liquor (44 mL). General instructions  Schedule regular health, dental, and eye exams.  Stay current with your vaccines.  Tell your health care provider if: ? You often feel depressed. ? You have ever been abused or do not feel safe at home. Summary  Adopting a healthy lifestyle and getting preventive care are important in promoting health and wellness.  Follow your health care provider's instructions about healthy  diet, exercising, and getting tested or screened for diseases.  Follow your health care provider's instructions on monitoring your cholesterol and blood pressure. This information is not intended to replace advice given to you by your health care provider. Make sure you discuss any questions you have with your health care provider. Document Released: 08/14/2010 Document Revised: 01/22/2018 Document Reviewed: 01/22/2018 Elsevier Patient Education  2020 Elsevier Inc.  

## 2019-01-22 NOTE — Progress Notes (Signed)
Virtual Visit via Telephone Note  I connected with Megan Stanton on 01/22/19 at  6:00 PM EST by telephone and verified that I am speaking with the correct person using two identifiers.     I discussed the limitations, risks, security and privacy concerns of performing an evaluation and management service by telephone and the availability of in person appointments. I also discussed with the patient that there may be a patient responsible charge related to this service. The patient expressed understanding and agreed to proceed.   History of Present Illness: Patient presents for follow up on DM2. She reports that her fasting blood sugars have been ranging from 90-200. She states that she is taking Victoza, Levemir and novolog. She has not started [place] and was instructed to stop levemir once she received it. She states one episode of hypoglycemia.   Observations/Objective: Patient does not appear to be in apparent distress as evidenced by talking coherently without difficulty.    Assessment and Plan: 1. Type 2 diabetes mellitus without complication, with long-term current use of insulin (HCC) - Insulin Degludec (TRESIBA FLEXTOUCH) 200 UNIT/ML SOPN; Inject 66-160 Units into the skin daily. Increase by 5 units a week until AM sugar is usually 90-130  Dispense: 60 mL; Refill: 2 - insulin aspart (NOVOLOG FLEXPEN) 100 UNIT/ML FlexPen; Inject 8 Units into the skin 3 (three) times daily with meals.  Dispense: 15 mL; Refill: 2 - liraglutide (VICTOZA) 18 MG/3ML SOPN; INJECT 1.8MG  UNDER THE SKIN EVERY MORNING  Dispense: 27 mL; Refill: 2    Follow Up Instructions: Medications sent to pharmacy on file. She now has medicaid and will no longer be able to be seen at this clinic.   I discussed the assessment and treatment plan with the patient. The patient was provided an opportunity to ask questions and all were answered. The patient agreed with the plan and demonstrated an understanding of the  instructions.   The patient was advised to call back or seek an in-person evaluation if the symptoms worsen or if the condition fails to improve as anticipated.  I provided 10 minutes of non-face-to-face time during this encounter.   Lanae Boast, FNP

## 2022-01-30 ENCOUNTER — Ambulatory Visit (LOCAL_COMMUNITY_HEALTH_CENTER): Payer: Medicaid Other

## 2022-01-30 DIAGNOSIS — Z111 Encounter for screening for respiratory tuberculosis: Secondary | ICD-10-CM

## 2022-02-02 ENCOUNTER — Other Ambulatory Visit: Payer: Medicaid Other

## 2022-02-02 ENCOUNTER — Ambulatory Visit (LOCAL_COMMUNITY_HEALTH_CENTER): Payer: Medicaid Other

## 2022-02-02 DIAGNOSIS — Z111 Encounter for screening for respiratory tuberculosis: Secondary | ICD-10-CM

## 2022-02-02 LAB — TB SKIN TEST
Induration: 0 mm
TB Skin Test: NEGATIVE

## 2022-12-04 ENCOUNTER — Other Ambulatory Visit: Payer: Self-pay | Admitting: Family Medicine

## 2022-12-04 DIAGNOSIS — Z1231 Encounter for screening mammogram for malignant neoplasm of breast: Secondary | ICD-10-CM

## 2023-05-06 ENCOUNTER — Ambulatory Visit: Admitting: Nurse Practitioner

## 2023-05-06 ENCOUNTER — Encounter: Payer: Self-pay | Admitting: Nurse Practitioner

## 2023-05-06 DIAGNOSIS — Z113 Encounter for screening for infections with a predominantly sexual mode of transmission: Secondary | ICD-10-CM

## 2023-05-06 LAB — WET PREP FOR TRICH, YEAST, CLUE
Trichomonas Exam: NEGATIVE
Yeast Exam: NEGATIVE

## 2023-05-06 LAB — HM HIV SCREENING LAB: HM HIV Screening: NEGATIVE

## 2023-05-06 NOTE — Progress Notes (Signed)
 [facility] Department STI clinic [address] [place] [address] Main phone: [phone]  STI screening visit  Subjective:  Megan Stanton is a 43 y.o. female being seen today for an STI screening visit. The patient reports they do not have symptoms.  Patient reports that they do desire a pregnancy in the next year.   They reported they are not interested in discussing contraception today.    Patient's last menstrual period was 04/15/2023 (approximate).  Patient has the following medical conditions:  Patient Active Problem List   Diagnosis Date Noted   GDM (gestational diabetes mellitus) 10/03/2015   Indication for care in labor or delivery 09/16/2015   Twin pregnancy, twins dichorionic and diamniotic 08/31/2015   Labor and delivery, indication for care 08/24/2015   History of stillbirth in currently pregnant patient 06/20/2015   Single umbilical artery, maternal, antepartum 06/20/2015   Multigravida of advanced maternal age in first trimester 04/18/2015   Dichorionic diamniotic twin pregnancy in first trimester 04/18/2015   Diabetes mellitus (HCC) 05/11/2012   Obesity 09/08/2011    Chief Complaint  Patient presents with   SEXUALLY TRANSMITTED DISEASE   Patient is a pleasant 43 y.o. female who presents to the office today requesting asymptomatic STI testing. Patient indicates 1 female partner in the last 2 months. She reports practicing vaginal sex and does not use condoms. Patient indicates a history of an STI but cannot recall what or when and states it was a long long time ago. Patient reports last sex was 04/26/23. She indicates no use of contraception method as she is currently seeking pregnancy.  Patient indicates LMP was about 04/15/23 and that she has periods monthly.     Does the patient using douching products? Not assessed.   Last HIV test per patient/review of record was No results found for: "HMHIVSCREEN"  Lab Results  Component  Value Date   HIV Non-reactive 08/10/2015     Last HEPC test per patient/review of record was No results found for: "HMHEPCSCREEN" No components found for: "HEPC"   Last HEPB test per patient/review of record was No components found for: "HMHEPBSCREEN"   Patient reports last pap was:    Component Value Date/Time   DIAGPAP  04/21/2018 0000    NEGATIVE FOR INTRAEPITHELIAL LESIONS OR MALIGNANCY.   DIAGPAP TRICHOMONAS VAGINALIS PRESENT. 04/21/2018 0000   ADEQPAP  04/21/2018 0000    Satisfactory for evaluation  endocervical/transformation zone component PRESENT.   No results found for: "SPECADGYN" Result Date Procedure Results Follow-ups  04/21/2018 Cytology - PAP([place]) Adequacy: Satisfactory for evaluation  endocervical/transformation zone component PRESENT. Diagnosis: NEGATIVE FOR INTRAEPITHELIAL LESIONS OR MALIGNANCY. Diagnosis: TRICHOMONAS VAGINALIS PRESENT. Material Submitted: CervicoVaginal Pap [ThinPrep Imaged]     Screening for MPX risk: Does the patient have an unexplained rash? No Is the patient MSM? No Does the patient endorse multiple sex partners or anonymous sex partners? No Did the patient have close or sexual contact with a person diagnosed with MPX? No Has the patient traveled outside the [place] where MPX is endemic? No Is there a high clinical suspicion for MPX-- evidenced by one of the following No  -Unlikely to be chickenpox  -Lymphadenopathy  -Rash that present in same phase of evolution on any given body part See flowsheet for further details and programmatic requirements.   Immunization history:  Immunization History  Administered Date(s) Administered   Influenza Inj Mdck Quad Pf 11/27/2018   Influenza-Unspecified 12/02/2012   PPD Test 01/30/2022  Tdap 08/10/2015     The following portions of the patient's history were reviewed and updated as appropriate: allergies, current medications, past medical history, past social history, past surgical history  and problem list.  Objective:  There were no vitals filed for this visit.  Physical Exam Nursing note reviewed.  Constitutional:      Appearance: Normal appearance.  HENT:     Head: Normocephalic.     Salivary Glands: Right salivary gland is not diffusely enlarged or tender. Left salivary gland is not diffusely enlarged or tender.     Mouth/Throat:     Lips: Pink. No lesions.     Mouth: Mucous membranes are moist.     Tongue: No lesions. Tongue does not deviate from midline.     Pharynx: Oropharynx is clear. Uvula midline. No oropharyngeal exudate or posterior oropharyngeal erythema.     Tonsils: No tonsillar exudate.  Eyes:     General:        Right eye: No discharge.        Left eye: No discharge.     Conjunctiva/sclera:     Right eye: Right conjunctiva is not injected. No exudate.    Left eye: Left conjunctiva is not injected. No exudate.    Comments: Patient with glasses in place.   Pulmonary:     Effort: Pulmonary effort is normal.  Genitourinary:    Comments: Patient asymptomatic. Declines genital exam. Self-swabbing.  Lymphadenopathy:     Head:     Right side of head: No submental, submandibular, tonsillar, preauricular or posterior auricular adenopathy.     Left side of head: No submental, submandibular, tonsillar, preauricular or posterior auricular adenopathy.     Cervical: No cervical adenopathy.     Right cervical: No superficial or posterior cervical adenopathy.    Left cervical: No superficial or posterior cervical adenopathy.     Upper Body:     Right upper body: No supraclavicular or axillary adenopathy.     Left upper body: No supraclavicular or axillary adenopathy.  Skin:    General: Skin is warm and dry.     Comments: Skin tone appropriate for ethnicity. Assessed exposed areas only and back.   Neurological:     Mental Status: She is alert and oriented to person, place, and time.  Psychiatric:        Attention and Perception: Attention and perception  normal.        Mood and Affect: Mood and affect normal.        Speech: Speech normal.        Behavior: Behavior normal. Behavior is cooperative.        Thought Content: Thought content normal.     Assessment and Plan:  Megan Stanton is a 43 y.o. female presenting to the [facility] Department for STI screening  1. Screening for venereal disease (Primary)  - Chlamydia/Gonorrhea [place] [facility] - HIV [place] [facility] - WET PREP FOR TRICH, YEAST, CLUE - Syphilis Serology, [place]  Of note, patient advised to begin taking multivitamin with folic acid since she reported she is currently seeking pregnancy.  Also advised patient she was due for PAP this year per ASCCP guidelines. Encouraged patient to schedule a return visit for this soon.   Patient accepted screenings including vaginal CT/GC and bloodwork for HIV/RPR, and wet prep. Patient meets criteria for HepB screening? No. Ordered? not applicable Patient meets criteria for HepC screening? No. Ordered? not applicable  Treat wet prep  per standing order Discussed time line for [facility] results and that patient will be called with positive results and encouraged patient to call if she had not heard in 2 weeks.  Counseled to return or seek care for continued or worsening symptoms Recommended repeat testing in 3 months with positive results. Recommended condom use with all sex for STI prevention.   Patient is currently not using  a method  to prevent pregnancy and is seeking pregnancy.  Return if symptoms worsen or fail to improve.  No future appointments.  Total time with patient 20 minutes.   Edmonia James, NP

## 2023-05-06 NOTE — Progress Notes (Signed)
 Pt here for STI screening.  Wet mount results reviewed with patient.  No treatment needed at this time.  Condoms declined.-Collins Scotland, RN

## 2023-07-01 ENCOUNTER — Other Ambulatory Visit: Payer: Self-pay | Admitting: Family Medicine

## 2023-07-01 DIAGNOSIS — Z1231 Encounter for screening mammogram for malignant neoplasm of breast: Secondary | ICD-10-CM

## 2023-07-22 ENCOUNTER — Ambulatory Visit
Admission: RE | Admit: 2023-07-22 | Discharge: 2023-07-22 | Disposition: A | Discharge status: Home / Self Care | Source: Ambulatory Visit | Attending: Family Medicine | Admitting: Family Medicine

## 2023-07-22 DIAGNOSIS — Z1231 Encounter for screening mammogram for malignant neoplasm of breast: Secondary | ICD-10-CM | POA: Diagnosis present
